# Patient Record
Sex: Female | Born: 1955 | Race: White | Hispanic: No | Smoking: Never smoker
Health system: Southern US, Community
[De-identification: ages and names within clinical notes are randomized; demographics above are authoritative.]

## PROBLEM LIST (undated history)

## (undated) DIAGNOSIS — I10 Essential (primary) hypertension: Secondary | ICD-10-CM

## (undated) DIAGNOSIS — M1990 Unspecified osteoarthritis, unspecified site: Secondary | ICD-10-CM

## (undated) DIAGNOSIS — G4730 Sleep apnea, unspecified: Secondary | ICD-10-CM

## (undated) DIAGNOSIS — I739 Peripheral vascular disease, unspecified: Secondary | ICD-10-CM

## (undated) DIAGNOSIS — H10022 Other mucopurulent conjunctivitis, left eye: Secondary | ICD-10-CM

## (undated) DIAGNOSIS — I219 Acute myocardial infarction, unspecified: Secondary | ICD-10-CM

## (undated) DIAGNOSIS — R011 Cardiac murmur, unspecified: Secondary | ICD-10-CM

## (undated) DIAGNOSIS — J189 Pneumonia, unspecified organism: Secondary | ICD-10-CM

## (undated) DIAGNOSIS — K219 Gastro-esophageal reflux disease without esophagitis: Secondary | ICD-10-CM

## (undated) DIAGNOSIS — Z8669 Personal history of other diseases of the nervous system and sense organs: Secondary | ICD-10-CM

## (undated) HISTORY — PX: OTHER SURGICAL HISTORY: SHX169

---

## 1999-02-25 ENCOUNTER — Inpatient Hospital Stay (HOSPITAL_COMMUNITY): Admission: RE | Admit: 1999-02-25 | Discharge: 1999-02-26 | Payer: Self-pay | Admitting: Neurosurgery

## 1999-02-25 ENCOUNTER — Encounter: Payer: Self-pay | Admitting: Neurosurgery

## 1999-03-25 ENCOUNTER — Encounter: Admission: RE | Admit: 1999-03-25 | Discharge: 1999-03-25 | Payer: Self-pay | Admitting: Neurosurgery

## 1999-03-25 ENCOUNTER — Encounter: Payer: Self-pay | Admitting: Neurosurgery

## 2008-12-18 ENCOUNTER — Emergency Department (HOSPITAL_COMMUNITY): Admission: EM | Admit: 2008-12-18 | Discharge: 2008-12-18 | Payer: Self-pay | Admitting: Family Medicine

## 2010-07-22 ENCOUNTER — Encounter (INDEPENDENT_AMBULATORY_CARE_PROVIDER_SITE_OTHER): Payer: Federal, State, Local not specified - PPO

## 2010-07-22 ENCOUNTER — Encounter (INDEPENDENT_AMBULATORY_CARE_PROVIDER_SITE_OTHER): Payer: Federal, State, Local not specified - PPO | Admitting: Vascular Surgery

## 2010-07-22 DIAGNOSIS — I83893 Varicose veins of bilateral lower extremities with other complications: Secondary | ICD-10-CM

## 2010-07-23 NOTE — Consult Note (Signed)
NEW PATIENT CONSULTATION  Lindsey Murphy, Lindsey Murphy DOB:  1955/11/29                                       07/22/2010 VFIEP#:[id number]  Patient presents today for of bilateral lower extremity pain and swelling.  She does not have any history of prior DVT.  She does have episodes of superficial thrombophlebitis in her left leg in the past. She had an area of wound hematoma and slow wound healing in her right lower calf.  No history of bleeding.  She does have some swelling in both legs and chronic aching in both lower extremities.  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  SUGGESTIONS:  She is married with 1 child.  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  She does not smoke or drink alcohol.  FAMILY HISTORY:  Negative for premature atherosclerotic disease.  REVIEW OF SYSTEMS:  No weight loss or gain.  She weighs 240 pounds.  She is 5 feet 6 inches tall. VASCULAR:  Positive for pain in her legs with walking and phlebitis. GI:  Reflux. MUSCULOSKELETAL:  Arthritis and joint pain. Review of systems otherwise negative.  PHYSICAL EXAMINATION:  Well-developed, well-nourished, moderately obese white female in no acute distress.  Blood pressure is 165/77, pulse 96, respirations 20.  HEENT:  Normal.  Her radial and dorsalis pedis pulses are 2+ bilaterally.  Musculoskeletal shows no major deformity or cyanosis.  Neurologic:  No focal weakness or paresthesias.  Skin without ulcers or rashes.  She does have marked telangiectasia in both lower extremities with no large varicosities.  She underwent noninvasive vascular laboratory studies in our office, and this shows no evidence of valvular reflux within the superficial system bilaterally.  She does have reflux in her deep veins bilaterally.  I discussed the significance of this with patient,  explaining the venous hypertension related to her deep system.  I explained that there is no procedure to correct this since her surface veins appear to be normal. I imaged this myself with SonoSite Ultrasound, and she does not have any large saphenous veins.  I explained the importance of elevation and compression due to her large size.  I feel that she would have a very hard, difficult time wearing thigh-high compressions; therefore, we have fitted her with knee-high compression and instructed her on the use of these.  She understands and will see [place] on an as-needed basis.    Larina Earthly, M.D. Electronically Signed  TFE/MEDQ  D:  07/22/2010  T:  07/23/2010  Job:  [id number]  cc:   [place] Charon

## 2010-07-30 NOTE — Procedures (Unsigned)
LOWER EXTREMITY VENOUS REFLUX EXAM  INDICATION:  Varicose veins.  EXAM:  Using color-flow imaging and pulse Doppler spectral analysis, the right and left common femoral, superficial femoral, popliteal, posterior tibial, greater and lesser saphenous veins are evaluated.  There is no evidence suggesting deep venous insufficiency in the right and left lower extremities.  The right and left saphenofemoral junctions are competent. The right and left GSV's are competent.  The right and left proximal small saphenous vein demonstrates competency.  GSV Diameter (used if found to be incompetent only)                                           Right    Left Proximal Greater Saphenous Vein           cm       cm Proximal-to-mid-thigh                     cm       cm Mid thigh                                 cm       cm Mid-distal thigh                          cm       cm Distal thigh                              cm       cm Knee                                      cm       cm  IMPRESSION: 1. Right and left great saphenous vein is competent. 2. The right and left greater saphenous vein is not tortuous. 3. The deep venous system is competent. 4. The right and left small saphenous vein is competent.  ___________________________________________ Larina Earthly, M.D.  OD/MEDQ  D:  07/22/2010  T:  07/22/2010  Job:  [id number]

## 2010-07-31 NOTE — Op Note (Signed)
[facility]  Patient:    Lindsey Murphy                      MRN: [id number] Proc. Date: 02/25/99 Adm. Date:  [id number] Attending:  Jackelyn Knife                           Operative Report  PREOPERATIVE DIAGNOSIS:  Herniated cervical disk at C5 and C6.  POSTOPERATIVE DIAGNOSIS:  Herniated cervical disk at C5 and C6.  OPERATION PERFORMED:  Anterior cervical diskectomy and fusion of C5 and C6; and  application of anterior cervical plate.  SURGEON:  Izell [place]. Elesa Hacker, M.D.  ASSISTANT:  Clydene Fake, M.D.  ANESTHESIA:  General endotracheal.  INDICATIONS:  The patient is a generally healthy 55 year old lady who presented  with severe neck pain with radiation of pain to her right arm.  This had gone on for a number of weeks and did not respond to conservative measures.  A cervical MRI was done and this demonstrated a quite large C5 and C6 soft disk herniation which was consistent with symptoms.  After an appropriate preoperative workup, as well as careful explanation of the  goals and risks involved, she was admitted for the procedure described below.  DESCRIPTION OF PROCEDURE:  Under general endotracheal anesthesia, this lady was  positioned supine on the operating table and in halter traction.  The anterior cervical region on the left side was prepped and the patient draped in the usual manner.  A preliminary cross table lateral film was taken with a metallic marker in place to more carefully position the incision.  After this was done, an incision was made from the midline on the left side of the neck, extending along the skin lines to the anterior border of the sternocleidomastoid.  Dissection was carried down through the subcutaneous tissue and platysma, and careful attention was paid to ensure adequate hemostasis.  Blunt dissection was then utilized to proceed down to the anterior vertebral border.  Self-retaining  retractors were put into place and a second cross table lateral was taken with the metallic marker the C5 and 6 interspace.  The longus colli muscles on either side of the vertebral bodies were dissected p. Self-retaining retractors were put in place and a Caspar intervertebral disk spreader likewise was put into place after the annulus had been incised with a  #15 blade.  Soft disk material was removed from the interspace all the way down to the posterior longitudinal ligament and central and right-sided, there was noted to be a quite large soft disk herniation posteriorly.  This was removed, after which, the posterior longitudinal ligament was sectioned in its entirety.  Additional oft tissue material was removed from the region of the takeoff of the nerve root on the right side.  After obtaining hemostasis and after making appropriate measurements, a bone graft was fashioned to the appropriate size and shape from allograft iliac crest bone. This tapped into place, after which all traction was removed.  After this, a 24 mm Blackstone anterior cervical plate was put in place using 14 mm screws. Additional cross table lateral films were taken to assure adequate and proper placement of the plate, screws, and bone graft.  After this, the wound was copiously irrigated with antibiotic solution and then  closed in layers.  A sterile dressing was applied.  The patient was taken to the recovery room in  good condition. DD:  02/25/99 TD:  02/26/99 Job: [id number]

## 2013-11-08 ENCOUNTER — Ambulatory Visit: Payer: Self-pay

## 2014-11-21 DIAGNOSIS — Q245 Malformation of coronary vessels: Secondary | ICD-10-CM | POA: Insufficient documentation

## 2014-11-21 DIAGNOSIS — I2510 Atherosclerotic heart disease of native coronary artery without angina pectoris: Secondary | ICD-10-CM

## 2014-11-21 DIAGNOSIS — Z01810 Encounter for preprocedural cardiovascular examination: Secondary | ICD-10-CM | POA: Insufficient documentation

## 2014-11-21 HISTORY — DX: Atherosclerotic heart disease of native coronary artery without angina pectoris: I25.10

## 2014-11-21 HISTORY — DX: Encounter for preprocedural cardiovascular examination: Z01.810

## 2014-11-21 HISTORY — DX: Malformation of coronary vessels: Q24.5

## 2014-12-04 NOTE — H&P (Signed)
TOTAL HIP ADMISSION H&P  Patient is admitted for left total hip arthroplasty, anterior approach.  Subjective:  Chief Complaint: Left hip primary OA / pain  HPI: Lindsey Murphy, 59 y.o. female, has a history of pain and functional disability in the left hip(s) due to arthritis and patient has failed non-surgical conservative treatments for greater than 12 weeks to include NSAID's and/or analgesics, corticosteriod injections, use of assistive devices and activity modification.  Onset of symptoms was gradual starting years ago with rapidlly worsening course for the past 2-3 months.  The patient noted no past surgery on the left hip(s).  Patient currently rates pain in the left hip at 10 out of 10 with activity. Patient has worsening of pain with activity and weight bearing, trendelenberg gait, pain that interfers with activities of daily living and pain with passive range of motion. Patient has evidence of periarticular osteophytes and joint space narrowing by imaging studies. This condition presents safety issues increasing the risk of falls.  There is no current active infection.  Risks, benefits and expectations were discussed with the patient.  Risks including but not limited to the risk of anesthesia, blood clots, nerve damage, blood vessel damage, failure of the prosthesis, infection and up to and including death.  Patient understand the risks, benefits and expectations and wishes to proceed with surgery.   PCP: Everlean Cherry, MD  D/C Plans:      Home with HHPT (mom's house)  Post-op Meds:       No Rx given   Tranexamic Acid:      To be given - topically (previous MI and DVTs)  Decadron:      Is to be given  FYI:     Plavix and ASA post-op  Norco post-op  CPAP    Past Medical History  Diagnosis Date  . Myocardial infarction     12/2012 - vasospasms   . Hypertension   . Heart murmur   . Peripheral vascular disease   . Sleep apnea     cpap- does not know settings  . Pneumonia      hx of pneumonia   . GERD (gastroesophageal reflux disease)   . Arthritis   . Hx of Bell's palsy   . Pink eye disease of left eye     diagnosed 12/06/2014     Past Surgical History  Procedure Laterality Date  . Left eye duct eye surgery     . Acterior cervical fusion     . Right knee surgery       No prescriptions prior to admission   Allergies  Allergen Reactions  . Lovastatin Er [Lovastatin] Other (See Comments)    Muscle aches   . Other     Polymyxin eye drops- eyes itching, swelling and redness   . Penicillins     Has patient had a PCN reaction causing immediate rash, facial/tongue/throat swelling, SOB or lightheadedness with hypotension: Yes Has patient had a PCN reaction causing severe rash involving mucus membranes or skin necrosis: No Has patient had a PCN reaction that required hospitalization No Has patient had a PCN reaction occurring within the last 10 years: Yes If all of the above answers are "NO", then may proceed with Cephalosporin use.     Social History  Substance Use Topics  . Smoking status: Never Smoker   . Smokeless tobacco: Never Used  . Alcohol Use: Not on file        Review of Systems  HENT: Negative.  Eyes: Negative.   Respiratory: Negative.   Cardiovascular: Negative.   Gastrointestinal: Positive for heartburn and constipation.  Genitourinary: Negative.   Musculoskeletal: Positive for back pain and joint pain.  Skin: Positive for itching and rash.  Neurological: Positive for weakness.  Endo/Heme/Allergies: Positive for environmental allergies.  Psychiatric/Behavioral: Negative.       Objective:  Physical Exam  Constitutional: She is oriented to person, place, and time. She appears well-developed and well-nourished.  HENT:  Head: Normocephalic and atraumatic.  Eyes: Pupils are equal, round, and reactive to light.  Neck: Neck supple. No JVD present. No tracheal deviation present. No thyromegaly present.  Cardiovascular: Normal  rate, regular rhythm and intact distal pulses.   Murmur heard. Respiratory: Effort normal and breath sounds normal. No stridor. No respiratory distress. She has no wheezes.  GI: Soft. There is no tenderness. There is no guarding.  Musculoskeletal:       Left hip: She exhibits decreased range of motion, decreased strength, tenderness and bony tenderness. She exhibits no swelling, no deformity and no laceration.  Lymphadenopathy:    She has no cervical adenopathy.  Neurological: She is alert and oriented to person, place, and time.  Skin: Skin is warm and dry.  Psychiatric: She has a normal mood and affect.      Imaging Review Plain radiographs demonstrate severe degenerative joint disease of the left hip(s). The bone quality appears to be good for age and reported activity level.  Assessment/Plan:  End stage arthritis, left hip(s)  The patient history, physical examination, clinical judgement of the provider and imaging studies are consistent with end stage degenerative joint disease of the left hip(s) and total hip arthroplasty is deemed medically necessary. The treatment options including medical management, injection therapy, arthroscopy and arthroplasty were discussed at length. The risks and benefits of total hip arthroplasty were presented and reviewed. The risks due to aseptic loosening, infection, stiffness, dislocation/subluxation,  thromboembolic complications and other imponderables were discussed.  The patient acknowledged the explanation, agreed to proceed with the plan and consent was signed. Patient is being admitted for inpatient treatment for surgery, pain control, PT, OT, prophylactic antibiotics, VTE prophylaxis, progressive ambulation and ADL's and discharge planning.The patient is planning to be discharged home with home health services.      Anastasio Auerbach Samar Dass   PA-C  12/14/2014, 5:59 PM

## 2014-12-05 NOTE — Patient Instructions (Addendum)
Lindsey Murphy  12/05/2014   Your procedure is scheduled on: 12/17/2014    Report to [facility] Main  Entrance take [facility]  elevators to 3rd floor to  Short Stay Center at   1120 AM.  Call this number if you have problems the morning of surgery [phone]   Remember: ONLY 1 PERSON MAY GO WITH YOU TO SHORT STAY TO GET  READY MORNING OF YOUR SURGERY.  Do not eat food after midnite.  May have clear liquids from 12 midnite until 0730am then nothing by mouth.       Take these medicines the morning of surgery with A SIP OF WATER:   Amlodipine ( Norvasc), allegra, Mucinex, metoprolol ( Lopressor), Protonix , Astelin nasal spray, Flonase nasal spray, alaway eye drops  DO NOT TAKE ANY DIABETIC MEDICATIONS DAY OF YOUR SURGERY                               You may not have any metal on your body including hair pins and              piercings  Do not wear jewelry, make-up, lotions, powders or perfumes, deodorant             Do not wear nail polish.  Do not shave  48 hours prior to surgery.                 Do not bring valuables to the hospital. [facility] IS NOT             RESPONSIBLE   FOR VALUABLES.  Contacts, dentures or bridgework may not be worn into surgery.  Leave suitcase in the car. After surgery it may be brought to your room.       Special Instructions: coughing and deep breathing exercises, leg exercises               Please read over the following fact sheets you were given: _____________________________________________________________________             [facility] - Preparing for Surgery Before surgery, you can play an important role.  Because skin is not sterile, your skin needs to be as free of germs as possible.  You can reduce the number of germs on your skin by washing with CHG (chlorahexidine gluconate) soap before surgery.  CHG is an antiseptic cleaner which kills germs and bonds with the skin to continue killing germs even after  washing. Please DO NOT use if you have an allergy to CHG or antibacterial soaps.  If your skin becomes reddened/irritated stop using the CHG and inform your nurse when you arrive at Short Stay. Do not shave (including legs and underarms) for at least 48 hours prior to the first CHG shower.  You may shave your face/neck. Please follow these instructions carefully:  1.  Shower with CHG Soap the night before surgery and the  morning of Surgery.  2.  If you choose to wash your hair, wash your hair first as usual with your  normal  shampoo.  3.  After you shampoo, rinse your hair and body thoroughly to remove the  shampoo.                           4.  Use CHG as you  would any other liquid soap.  You can apply chg directly  to the skin and wash                       Gently with a scrungie or clean washcloth.  5.  Apply the CHG Soap to your body ONLY FROM THE NECK DOWN.   Do not use on face/ open                           Wound or open sores. Avoid contact with eyes, ears mouth and genitals (private parts).                       Wash face,  Genitals (private parts) with your normal soap.             6.  Wash thoroughly, paying special attention to the area where your surgery  will be performed.  7.  Thoroughly rinse your body with warm water from the neck down.  8.  DO NOT shower/wash with your normal soap after using and rinsing off  the CHG Soap.                9.  Pat yourself dry with a clean towel.            10.  Wear clean pajamas.            11.  Place clean sheets on your bed the night of your first shower and do not  sleep with pets. Day of Surgery : Do not apply any lotions/deodorants the morning of surgery.  Please wear clean clothes to the hospital/surgery center.  FAILURE TO FOLLOW THESE INSTRUCTIONS MAY RESULT IN THE CANCELLATION OF YOUR SURGERY PATIENT SIGNATURE_________________________________  NURSE  SIGNATURE__________________________________  ________________________________________________________________________    CLEAR LIQUID DIET   Foods Allowed                                                                     Foods Excluded  Coffee and tea, regular and decaf                             liquids that you cannot  Plain Jell-O in any flavor                                             see through such as: Fruit ices (not with fruit pulp)                                     milk, soups, orange juice  Iced Popsicles                                    All solid food Carbonated beverages, regular and diet  Cranberry, grape and apple juices Sports drinks like Gatorade Lightly seasoned clear broth or consume(fat free) Sugar, honey syrup  Sample Menu Breakfast                                Lunch                                     Supper Cranberry juice                    Beef broth                            Chicken broth Jell-O                                     Grape juice                           Apple juice Coffee or tea                        Jell-O                                      Popsicle                                                Coffee or tea                        Coffee or tea  _____________________________________________________________________   WHAT IS A BLOOD TRANSFUSION? Blood Transfusion Information  A transfusion is the replacement of blood or some of its parts. Blood is made up of multiple cells which provide different functions.  Red blood cells carry oxygen and are used for blood loss replacement.  White blood cells fight against infection.  Platelets control bleeding.  Plasma helps clot blood.  Other blood products are available for specialized needs, such as hemophilia or other clotting disorders. BEFORE THE TRANSFUSION  Who gives blood for transfusions?   Healthy volunteers who are fully evaluated  to make sure their blood is safe. This is blood bank blood. Transfusion therapy is the safest it has ever been in the practice of medicine. Before blood is taken from a donor, a complete history is taken to make sure that person has no history of diseases nor engages in risky social behavior (examples are intravenous drug use or sexual activity with multiple partners). The donor's travel history is screened to minimize risk of transmitting infections, such as malaria. The donated blood is tested for signs of infectious diseases, such as HIV and hepatitis. The blood is then tested to be sure it is compatible with you in order to minimize the chance of a transfusion reaction. If you or a relative donates blood, this is often done in anticipation of surgery and is not appropriate for emergency situations. It takes many days to process the donated blood.  RISKS AND COMPLICATIONS Although transfusion therapy is very safe and saves many lives, the main dangers of transfusion include:  1. Getting an infectious disease. 2. Developing a transfusion reaction. This is an allergic reaction to something in the blood you were given. Every precaution is taken to prevent this. The decision to have a blood transfusion has been considered carefully by your caregiver before blood is given. Blood is not given unless the benefits outweigh the risks. AFTER THE TRANSFUSION  Right after receiving a blood transfusion, you will usually feel much better and more energetic. This is especially true if your red blood cells have gotten low (anemic). The transfusion raises the level of the red blood cells which carry oxygen, and this usually causes an energy increase.  The nurse administering the transfusion will monitor you carefully for complications. HOME CARE INSTRUCTIONS  No special instructions are needed after a transfusion. You may find your energy is better. Speak with your caregiver about any limitations on activity for  underlying diseases you may have. SEEK MEDICAL CARE IF:   Your condition is not improving after your transfusion.  You develop redness or irritation at the intravenous (IV) site. SEEK IMMEDIATE MEDICAL CARE IF:  Any of the following symptoms occur over the next 12 hours:  Shaking chills.  You have a temperature by mouth above 102 F (38.9 C), not controlled by medicine.  Chest, back, or muscle pain.  People around you feel you are not acting correctly or are confused.  Shortness of breath or difficulty breathing.  Dizziness and fainting.  You get a rash or develop hives.  You have a decrease in urine output.  Your urine turns a dark color or changes to pink, red, or brown. Any of the following symptoms occur over the next 10 days:  You have a temperature by mouth above 102 F (38.9 C), not controlled by medicine.  Shortness of breath.  Weakness after normal activity.  The white part of the eye turns yellow (jaundice).  You have a decrease in the amount of urine or are urinating less often.  Your urine turns a dark color or changes to pink, red, or brown. Document Released: 02/27/2000 Document Revised: 05/24/2011 Document Reviewed: 10/16/2007 ExitCare Patient Information 2014 [place].  _______________________________________________________________________  Incentive Spirometer  An incentive spirometer is a tool that can help keep your lungs clear and active. This tool measures how well you are filling your lungs with each breath. Taking long deep breaths may help reverse or decrease the chance of developing breathing (pulmonary) problems (especially infection) following:  A long period of time when you are unable to move or be active. BEFORE THE PROCEDURE   If the spirometer includes an indicator to show your best effort, your nurse or respiratory therapist will set it to a desired goal.  If possible, sit up straight or lean slightly forward. Try not to  slouch.  Hold the incentive spirometer in an upright position. INSTRUCTIONS FOR USE  3. Sit on the edge of your bed if possible, or sit up as far as you can in bed or on a chair. 4. Hold the incentive spirometer in an upright position. 5. Breathe out normally. 6. Place the mouthpiece in your mouth and seal your lips tightly around it. 7. Breathe in slowly and as deeply as possible, raising the piston or the ball toward the top of the column. 8. Hold your breath for 3-5 seconds or for as long as possible. Allow the piston  or ball to fall to the bottom of the column. 9. Remove the mouthpiece from your mouth and breathe out normally. 10. Rest for a few seconds and repeat Steps 1 through 7 at least 10 times every 1-2 hours when you are awake. Take your time and take a few normal breaths between deep breaths. 11. The spirometer may include an indicator to show your best effort. Use the indicator as a goal to work toward during each repetition. 12. After each set of 10 deep breaths, practice coughing to be sure your lungs are clear. If you have an incision (the cut made at the time of surgery), support your incision when coughing by placing a pillow or rolled up towels firmly against it. Once you are able to get out of bed, walk around indoors and cough well. You may stop using the incentive spirometer when instructed by your caregiver.  RISKS AND COMPLICATIONS  Take your time so you do not get dizzy or light-headed.  If you are in pain, you may need to take or ask for pain medication before doing incentive spirometry. It is harder to take a deep breath if you are having pain. AFTER USE  Rest and breathe slowly and easily.  It can be helpful to keep track of a log of your progress. Your caregiver can provide you with a simple table to help with this. If you are using the spirometer at home, follow these instructions: [place] IF:   You are having difficultly using the  spirometer.  You have trouble using the spirometer as often as instructed.  Your pain medication is not giving enough relief while using the spirometer.  You develop fever of 100.5 F (38.1 C) or higher. SEEK IMMEDIATE MEDICAL CARE IF:   You cough up bloody sputum that had not been present before.  You develop fever of 102 F (38.9 C) or greater.  You develop worsening pain at or near the incision site. MAKE SURE YOU:   Understand these instructions.  Will watch your condition.  Will get help right away if you are not doing well or get worse. Document Released: 07/12/2006 Document Revised: 05/24/2011 Document Reviewed: 09/12/2006 [facility] Patient Information 2014 [place], [place].   ________________________________________________________________________

## 2014-12-10 ENCOUNTER — Encounter (HOSPITAL_COMMUNITY): Payer: Self-pay

## 2014-12-10 ENCOUNTER — Encounter (HOSPITAL_COMMUNITY)
Admission: RE | Admit: 2014-12-10 | Discharge: 2014-12-10 | Disposition: A | Discharge status: Home / Self Care | Payer: 59 | Source: Ambulatory Visit | Attending: Orthopedic Surgery | Admitting: Orthopedic Surgery

## 2014-12-10 DIAGNOSIS — Z01818 Encounter for other preprocedural examination: Secondary | ICD-10-CM | POA: Diagnosis not present

## 2014-12-10 DIAGNOSIS — M1612 Unilateral primary osteoarthritis, left hip: Secondary | ICD-10-CM | POA: Insufficient documentation

## 2014-12-10 HISTORY — DX: Cardiac murmur, unspecified: R01.1

## 2014-12-10 HISTORY — DX: Sleep apnea, unspecified: G47.30

## 2014-12-10 HISTORY — DX: Pneumonia, unspecified organism: J18.9

## 2014-12-10 HISTORY — DX: Essential (primary) hypertension: I10

## 2014-12-10 HISTORY — DX: Peripheral vascular disease, unspecified: I73.9

## 2014-12-10 HISTORY — DX: Acute myocardial infarction, unspecified: I21.9

## 2014-12-10 HISTORY — DX: Unspecified osteoarthritis, unspecified site: M19.90

## 2014-12-10 HISTORY — DX: Personal history of other diseases of the nervous system and sense organs: Z86.69

## 2014-12-10 HISTORY — DX: Gastro-esophageal reflux disease without esophagitis: K21.9

## 2014-12-10 HISTORY — DX: Other mucopurulent conjunctivitis, left eye: H10.022

## 2014-12-10 LAB — CBC
HCT: 40.4 % (ref 36.0–46.0)
HEMOGLOBIN: 13 g/dL (ref 12.0–15.0)
MCH: 32 pg (ref 26.0–34.0)
MCHC: 32.2 g/dL (ref 30.0–36.0)
MCV: 99.5 fL (ref 78.0–100.0)
PLATELETS: 183 10*3/uL (ref 150–400)
RBC: 4.06 MIL/uL (ref 3.87–5.11)
RDW: 13.4 % (ref 11.5–15.5)
WBC: 5.8 10*3/uL (ref 4.0–10.5)

## 2014-12-10 LAB — BASIC METABOLIC PANEL
Anion gap: 8 (ref 5–15)
BUN: 18 mg/dL (ref 6–20)
CALCIUM: 9.4 mg/dL (ref 8.9–10.3)
CHLORIDE: 104 mmol/L (ref 101–111)
CO2: 29 mmol/L (ref 22–32)
CREATININE: 0.7 mg/dL (ref 0.44–1.00)
GFR calc non Af Amer: >60 mL/min (ref >60)
GLUCOSE: 110 mg/dL — AB (ref 65–99)
Potassium: 3.7 mmol/L (ref 3.5–5.1)
Sodium: 141 mmol/L (ref 135–145)

## 2014-12-10 LAB — URINALYSIS, ROUTINE W REFLEX MICROSCOPIC
BILIRUBIN URINE: NEGATIVE
Glucose, UA: NEGATIVE mg/dL
Hgb urine dipstick: NEGATIVE
KETONES UR: NEGATIVE mg/dL
LEUKOCYTES UA: NEGATIVE
NITRITE: NEGATIVE
Protein, ur: NEGATIVE mg/dL
Specific Gravity, Urine: 1.018 (ref 1.005–1.030)
UROBILINOGEN UA: 0.2 mg/dL (ref 0.0–1.0)
pH: 5.5 (ref 5.0–8.0)

## 2014-12-10 LAB — PROTIME-INR
INR: 1.16 (ref 0.00–1.49)
PROTHROMBIN TIME: 14.9 s (ref 11.6–15.2)

## 2014-12-10 LAB — SURGICAL PCR SCREEN
MRSA, PCR: NEGATIVE
STAPHYLOCOCCUS AUREUS: NEGATIVE

## 2014-12-10 LAB — APTT: aPTT: 29 seconds (ref 24–37)

## 2014-12-10 NOTE — Progress Notes (Addendum)
Clearance Dr Dulce Sellar 11/25/2014 on chart  Clearance DR Martha Clan- on chart  Stress Echo- 02/2014 on chart  EKG- 03/19/2014 on chart  LOV with Dr Dulce Sellar- 11/25/14 on [facility] in [facility]

## 2014-12-11 ENCOUNTER — Encounter (HOSPITAL_COMMUNITY): Payer: Self-pay

## 2014-12-11 LAB — ABO/RH: ABO/RH(D): O POS

## 2014-12-11 NOTE — Progress Notes (Signed)
Left message on voice mail of Lindsey Murphy regarding pink eye diagnosis to make sure DR Charlann Boxer aware and that patient had notified office.

## 2014-12-11 NOTE — Progress Notes (Signed)
Patient came to preop appt on 12/10/14 with diagnosed pink eye.  Had reaction to eye drops on 12/07/14.  Patient to pick up new eye drop script on 12/10/14.  Instructed patient to let Dr Charlann Boxer be aware.  Patient voiced understanding.

## 2014-12-11 NOTE — Progress Notes (Signed)
Rosalva Ferron called back from office of Dr Charlann Boxer and stated she would inform him of the pink eye diagnosis on 12/12/2014.

## 2014-12-17 ENCOUNTER — Inpatient Hospital Stay (HOSPITAL_COMMUNITY)
Admission: RE | Admit: 2014-12-17 | Discharge: 2014-12-18 | DRG: 470 | Disposition: A | Discharge status: Home Health | Payer: 59 | Source: Ambulatory Visit | Attending: Orthopedic Surgery | Admitting: Orthopedic Surgery

## 2014-12-17 ENCOUNTER — Inpatient Hospital Stay (HOSPITAL_COMMUNITY): Payer: 59

## 2014-12-17 ENCOUNTER — Inpatient Hospital Stay (HOSPITAL_COMMUNITY): Payer: 59 | Admitting: Certified Registered"

## 2014-12-17 ENCOUNTER — Encounter (HOSPITAL_COMMUNITY)
Admission: RE | Disposition: A | Discharge status: Home Health | Payer: Self-pay | Source: Ambulatory Visit | Attending: Orthopedic Surgery

## 2014-12-17 ENCOUNTER — Encounter (HOSPITAL_COMMUNITY): Payer: Self-pay | Admitting: *Deleted

## 2014-12-17 DIAGNOSIS — M1612 Unilateral primary osteoarthritis, left hip: Secondary | ICD-10-CM | POA: Diagnosis present

## 2014-12-17 DIAGNOSIS — Z9889 Other specified postprocedural states: Secondary | ICD-10-CM

## 2014-12-17 DIAGNOSIS — Z981 Arthrodesis status: Secondary | ICD-10-CM

## 2014-12-17 DIAGNOSIS — Z01812 Encounter for preprocedural laboratory examination: Secondary | ICD-10-CM | POA: Diagnosis not present

## 2014-12-17 DIAGNOSIS — Z6838 Body mass index (BMI) 38.0-38.9, adult: Secondary | ICD-10-CM | POA: Diagnosis not present

## 2014-12-17 DIAGNOSIS — K219 Gastro-esophageal reflux disease without esophagitis: Secondary | ICD-10-CM | POA: Diagnosis present

## 2014-12-17 DIAGNOSIS — M25552 Pain in left hip: Secondary | ICD-10-CM | POA: Diagnosis present

## 2014-12-17 DIAGNOSIS — I252 Old myocardial infarction: Secondary | ICD-10-CM

## 2014-12-17 DIAGNOSIS — E669 Obesity, unspecified: Secondary | ICD-10-CM | POA: Diagnosis present

## 2014-12-17 DIAGNOSIS — Z96649 Presence of unspecified artificial hip joint: Secondary | ICD-10-CM | POA: Insufficient documentation

## 2014-12-17 DIAGNOSIS — G4730 Sleep apnea, unspecified: Secondary | ICD-10-CM | POA: Diagnosis present

## 2014-12-17 DIAGNOSIS — I10 Essential (primary) hypertension: Secondary | ICD-10-CM | POA: Diagnosis present

## 2014-12-17 HISTORY — DX: Presence of unspecified artificial hip joint: Z96.649

## 2014-12-17 HISTORY — DX: Other specified postprocedural states: Z98.890

## 2014-12-17 HISTORY — PX: TOTAL HIP ARTHROPLASTY: SHX124

## 2014-12-17 LAB — TYPE AND SCREEN
ABO/RH(D): O POS
ANTIBODY SCREEN: NEGATIVE

## 2014-12-17 SURGERY — ARTHROPLASTY, HIP, TOTAL, ANTERIOR APPROACH
Anesthesia: Spinal | Site: Hip | Laterality: Left

## 2014-12-17 MED ORDER — CELECOXIB 200 MG PO CAPS
200.0000 mg | ORAL_CAPSULE | Freq: Two times a day (BID) | ORAL | Status: DC
  Administered 2014-12-17 – 2014-12-18 (×2): 200 mg via ORAL
  Filled 2014-12-17 (×3): qty 1

## 2014-12-17 MED ORDER — PROMETHAZINE HCL 25 MG/ML IJ SOLN: 6.2500 mg | INTRAMUSCULAR | Status: DC | PRN

## 2014-12-17 MED ORDER — CEFAZOLIN SODIUM-DEXTROSE 2-3 GM-% IV SOLR: 2.0000 g | INTRAVENOUS | Status: DC

## 2014-12-17 MED ORDER — CLINDAMYCIN PHOSPHATE 900 MG/50ML IV SOLN
900.0000 mg | Freq: Once | INTRAVENOUS | Status: AC
  Administered 2014-12-17: 900 mg via INTRAVENOUS

## 2014-12-17 MED ORDER — METOPROLOL TARTRATE 25 MG PO TABS
25.0000 mg | ORAL_TABLET | Freq: Two times a day (BID) | ORAL | Status: DC
  Administered 2014-12-17: 25 mg via ORAL
  Filled 2014-12-17 (×3): qty 1

## 2014-12-17 MED ORDER — HYDROMORPHONE HCL 1 MG/ML IJ SOLN
0.2500 mg | INTRAMUSCULAR | Status: DC | PRN
  Administered 2014-12-17: 0.5 mg via INTRAVENOUS

## 2014-12-17 MED ORDER — ONDANSETRON HCL 4 MG PO TABS: 4.0000 mg | ORAL_TABLET | Freq: Four times a day (QID) | ORAL | Status: DC | PRN

## 2014-12-17 MED ORDER — METHOCARBAMOL 1000 MG/10ML IJ SOLN
500.0000 mg | Freq: Four times a day (QID) | INTRAVENOUS | Status: DC | PRN
  Filled 2014-12-17: qty 5

## 2014-12-17 MED ORDER — TRANEXAMIC ACID 1000 MG/10ML IV SOLN
2000.0000 mg | INTRAVENOUS | Status: DC | PRN
  Administered 2014-12-17: 2000 mg via TOPICAL

## 2014-12-17 MED ORDER — 0.9 % SODIUM CHLORIDE (POUR BTL) OPTIME
TOPICAL | Status: DC | PRN
  Administered 2014-12-17: 1000 mL

## 2014-12-17 MED ORDER — MEPERIDINE HCL 50 MG/ML IJ SOLN: 6.2500 mg | INTRAMUSCULAR | Status: DC | PRN

## 2014-12-17 MED ORDER — DOCUSATE SODIUM 100 MG PO CAPS
100.0000 mg | ORAL_CAPSULE | Freq: Two times a day (BID) | ORAL | Status: DC
  Administered 2014-12-17 – 2014-12-18 (×2): 100 mg via ORAL

## 2014-12-17 MED ORDER — METOCLOPRAMIDE HCL 10 MG PO TABS: 5.0000 mg | ORAL_TABLET | Freq: Three times a day (TID) | ORAL | Status: DC | PRN

## 2014-12-17 MED ORDER — METHOCARBAMOL 500 MG PO TABS
500.0000 mg | ORAL_TABLET | Freq: Four times a day (QID) | ORAL | Status: DC | PRN
  Administered 2014-12-18 (×2): 500 mg via ORAL
  Filled 2014-12-17 (×2): qty 1

## 2014-12-17 MED ORDER — PANTOPRAZOLE SODIUM 40 MG PO TBEC
40.0000 mg | DELAYED_RELEASE_TABLET | Freq: Two times a day (BID) | ORAL | Status: DC
  Administered 2014-12-17 – 2014-12-18 (×2): 40 mg via ORAL
  Filled 2014-12-17 (×3): qty 1

## 2014-12-17 MED ORDER — NITROGLYCERIN 0.4 MG SL SUBL: 0.4000 mg | SUBLINGUAL_TABLET | SUBLINGUAL | Status: DC | PRN

## 2014-12-17 MED ORDER — BUPIVACAINE HCL (PF) 0.5 % IJ SOLN
INTRAMUSCULAR | Status: DC | PRN
  Administered 2014-12-17: 3 mL via INTRATHECAL

## 2014-12-17 MED ORDER — PROPOFOL 10 MG/ML IV BOLUS
INTRAVENOUS | Status: AC
  Filled 2014-12-17: qty 20

## 2014-12-17 MED ORDER — HALOBETASOL PROPIONATE 0.05 % EX CREA
TOPICAL_CREAM | Freq: Two times a day (BID) | CUTANEOUS | Status: DC
  Administered 2014-12-18: via TOPICAL

## 2014-12-17 MED ORDER — CHLORHEXIDINE GLUCONATE 4 % EX LIQD: 60.0000 mL | Freq: Once | CUTANEOUS | Status: DC

## 2014-12-17 MED ORDER — MIDAZOLAM HCL 2 MG/2ML IJ SOLN
INTRAMUSCULAR | Status: AC
  Filled 2014-12-17: qty 4

## 2014-12-17 MED ORDER — MAGNESIUM CITRATE PO SOLN: 1.0000 | Freq: Once | ORAL | Status: DC | PRN

## 2014-12-17 MED ORDER — VANCOMYCIN HCL IN DEXTROSE 1-5 GM/200ML-% IV SOLN
1000.0000 mg | Freq: Two times a day (BID) | INTRAVENOUS | Status: AC
  Administered 2014-12-17: 1000 mg via INTRAVENOUS
  Filled 2014-12-17: qty 200

## 2014-12-17 MED ORDER — ASPIRIN EC 325 MG PO TBEC
325.0000 mg | DELAYED_RELEASE_TABLET | Freq: Every day | ORAL | Status: DC
  Administered 2014-12-18: 325 mg via ORAL
  Filled 2014-12-17 (×2): qty 1

## 2014-12-17 MED ORDER — FLUTICASONE PROPIONATE 50 MCG/ACT NA SUSP
2.0000 | Freq: Every day | NASAL | Status: DC
  Administered 2014-12-18: 2 via NASAL
  Filled 2014-12-17: qty 16

## 2014-12-17 MED ORDER — MONTELUKAST SODIUM 10 MG PO TABS
10.0000 mg | ORAL_TABLET | Freq: Every day | ORAL | Status: DC
  Administered 2014-12-17: 10 mg via ORAL
  Filled 2014-12-17 (×2): qty 1

## 2014-12-17 MED ORDER — PROPOFOL 10 MG/ML IV BOLUS
INTRAVENOUS | Status: DC | PRN
  Administered 2014-12-17 (×2): 20 mg via INTRAVENOUS

## 2014-12-17 MED ORDER — HYDROMORPHONE HCL 1 MG/ML IJ SOLN: 0.5000 mg | INTRAMUSCULAR | Status: DC | PRN

## 2014-12-17 MED ORDER — LIDOCAINE HCL (CARDIAC) 20 MG/ML IV SOLN
INTRAVENOUS | Status: AC
  Filled 2014-12-17: qty 5

## 2014-12-17 MED ORDER — AMLODIPINE BESYLATE 5 MG PO TABS
5.0000 mg | ORAL_TABLET | Freq: Every day | ORAL | Status: DC
  Administered 2014-12-17: 5 mg via ORAL
  Filled 2014-12-17 (×2): qty 1

## 2014-12-17 MED ORDER — POLYETHYLENE GLYCOL 3350 17 G PO PACK
17.0000 g | PACK | Freq: Two times a day (BID) | ORAL | Status: DC
  Administered 2014-12-17 – 2014-12-18 (×2): 17 g via ORAL

## 2014-12-17 MED ORDER — DEXAMETHASONE SODIUM PHOSPHATE 10 MG/ML IJ SOLN
10.0000 mg | Freq: Once | INTRAMUSCULAR | Status: AC
  Administered 2014-12-17: 10 mg via INTRAVENOUS

## 2014-12-17 MED ORDER — MENTHOL 3 MG MT LOZG: 1.0000 | LOZENGE | OROMUCOSAL | Status: DC | PRN

## 2014-12-17 MED ORDER — ATORVASTATIN CALCIUM 10 MG PO TABS
10.0000 mg | ORAL_TABLET | Freq: Every day | ORAL | Status: DC
Start: 2014-12-17 — End: 2014-12-18
  Administered 2014-12-17: 10 mg via ORAL
  Filled 2014-12-17 (×2): qty 1

## 2014-12-17 MED ORDER — LACTATED RINGERS IV SOLN
INTRAVENOUS | Status: DC
  Administered 2014-12-17 (×2): 1000 mL via INTRAVENOUS
  Administered 2014-12-17 (×3): via INTRAVENOUS

## 2014-12-17 MED ORDER — ONDANSETRON HCL 4 MG/2ML IJ SOLN: 4.0000 mg | Freq: Four times a day (QID) | INTRAMUSCULAR | Status: DC | PRN

## 2014-12-17 MED ORDER — AZELASTINE HCL 0.1 % NA SOLN
1.0000 | Freq: Two times a day (BID) | NASAL | Status: DC
  Administered 2014-12-17 – 2014-12-18 (×2): 1 via NASAL
  Filled 2014-12-17: qty 30

## 2014-12-17 MED ORDER — ONDANSETRON HCL 4 MG/2ML IJ SOLN
INTRAMUSCULAR | Status: DC | PRN
  Administered 2014-12-17: 4 mg via INTRAVENOUS

## 2014-12-17 MED ORDER — CLINDAMYCIN PHOSPHATE 900 MG/50ML IV SOLN
INTRAVENOUS | Status: AC
  Filled 2014-12-17: qty 50

## 2014-12-17 MED ORDER — KETOTIFEN FUMARATE 0.025 % OP SOLN
1.0000 [drp] | Freq: Two times a day (BID) | OPHTHALMIC | Status: DC
  Administered 2014-12-17 – 2014-12-18 (×2): 1 [drp] via OPHTHALMIC
  Filled 2014-12-17: qty 5

## 2014-12-17 MED ORDER — ROCURONIUM BROMIDE 100 MG/10ML IV SOLN
INTRAVENOUS | Status: AC
  Filled 2014-12-17: qty 1

## 2014-12-17 MED ORDER — LIDOCAINE HCL (CARDIAC) 20 MG/ML IV SOLN
INTRAVENOUS | Status: DC | PRN
  Administered 2014-12-17: 50 mg via INTRAVENOUS

## 2014-12-17 MED ORDER — HYDROMORPHONE HCL 1 MG/ML IJ SOLN
INTRAMUSCULAR | Status: AC
  Filled 2014-12-17: qty 1

## 2014-12-17 MED ORDER — CLOPIDOGREL BISULFATE 75 MG PO TABS
75.0000 mg | ORAL_TABLET | Freq: Every day | ORAL | Status: DC
  Administered 2014-12-18: 75 mg via ORAL
  Filled 2014-12-17 (×3): qty 1

## 2014-12-17 MED ORDER — BISACODYL 10 MG RE SUPP: 10.0000 mg | Freq: Every day | RECTAL | Status: DC | PRN

## 2014-12-17 MED ORDER — METOCLOPRAMIDE HCL 5 MG/ML IJ SOLN: 5.0000 mg | Freq: Three times a day (TID) | INTRAMUSCULAR | Status: DC | PRN

## 2014-12-17 MED ORDER — FERROUS SULFATE 325 (65 FE) MG PO TABS
325.0000 mg | ORAL_TABLET | Freq: Three times a day (TID) | ORAL | Status: DC
  Administered 2014-12-18 (×2): 325 mg via ORAL
  Filled 2014-12-17 (×4): qty 1

## 2014-12-17 MED ORDER — FENTANYL CITRATE (PF) 100 MCG/2ML IJ SOLN
INTRAMUSCULAR | Status: DC | PRN
  Administered 2014-12-17: 100 ug via INTRAVENOUS

## 2014-12-17 MED ORDER — PHENOL 1.4 % MT LIQD: 1.0000 | OROMUCOSAL | Status: DC | PRN

## 2014-12-17 MED ORDER — LORATADINE 10 MG PO TABS
10.0000 mg | ORAL_TABLET | Freq: Every day | ORAL | Status: DC
  Administered 2014-12-18: 10 mg via ORAL
  Filled 2014-12-17: qty 1

## 2014-12-17 MED ORDER — ONDANSETRON HCL 4 MG/2ML IJ SOLN
INTRAMUSCULAR | Status: AC
  Filled 2014-12-17: qty 2

## 2014-12-17 MED ORDER — PHENYLEPHRINE HCL 10 MG/ML IJ SOLN
INTRAMUSCULAR | Status: DC | PRN
  Administered 2014-12-17: 80 ug via INTRAVENOUS
  Administered 2014-12-17: 40 ug via INTRAVENOUS
  Administered 2014-12-17 (×3): 80 ug via INTRAVENOUS

## 2014-12-17 MED ORDER — TRANEXAMIC ACID 1000 MG/10ML IV SOLN
2000.0000 mg | Freq: Once | INTRAVENOUS | Status: DC
  Filled 2014-12-17: qty 20

## 2014-12-17 MED ORDER — GABAPENTIN 100 MG PO CAPS
100.0000 mg | ORAL_CAPSULE | Freq: Two times a day (BID) | ORAL | Status: DC
  Administered 2014-12-17 – 2014-12-18 (×2): 100 mg via ORAL
  Filled 2014-12-17 (×3): qty 1

## 2014-12-17 MED ORDER — BUPIVACAINE HCL (PF) 0.5 % IJ SOLN
INTRAMUSCULAR | Status: AC
Start: 2014-12-17 — End: 2014-12-17
  Filled 2014-12-17: qty 30

## 2014-12-17 MED ORDER — DIPHENHYDRAMINE HCL 25 MG PO CAPS: 25.0000 mg | ORAL_CAPSULE | Freq: Four times a day (QID) | ORAL | Status: DC | PRN

## 2014-12-17 MED ORDER — DEXAMETHASONE SODIUM PHOSPHATE 10 MG/ML IJ SOLN
10.0000 mg | Freq: Once | INTRAMUSCULAR | Status: AC
  Administered 2014-12-18: 10 mg via INTRAVENOUS
  Filled 2014-12-17: qty 1

## 2014-12-17 MED ORDER — GUAIFENESIN ER 600 MG PO TB12
600.0000 mg | ORAL_TABLET | Freq: Two times a day (BID) | ORAL | Status: DC
  Administered 2014-12-17 – 2014-12-18 (×2): 600 mg via ORAL
  Filled 2014-12-17 (×3): qty 1

## 2014-12-17 MED ORDER — PROPOFOL 500 MG/50ML IV EMUL
INTRAVENOUS | Status: DC | PRN
  Administered 2014-12-17: 75 ug/kg/min via INTRAVENOUS

## 2014-12-17 MED ORDER — SODIUM CHLORIDE 0.9 % IV SOLN
100.0000 mL/h | INTRAVENOUS | Status: DC
  Administered 2014-12-17 – 2014-12-18 (×2): 100 mL/h via INTRAVENOUS
  Filled 2014-12-17 (×4): qty 1000

## 2014-12-17 MED ORDER — FENTANYL CITRATE (PF) 250 MCG/5ML IJ SOLN
INTRAMUSCULAR | Status: AC
  Filled 2014-12-17: qty 25

## 2014-12-17 MED ORDER — LACTATED RINGERS IV SOLN: INTRAVENOUS | Status: DC

## 2014-12-17 MED ORDER — MIDAZOLAM HCL 5 MG/5ML IJ SOLN
INTRAMUSCULAR | Status: DC | PRN
  Administered 2014-12-17: 2 mg via INTRAVENOUS

## 2014-12-17 MED ORDER — HYDROCODONE-ACETAMINOPHEN 7.5-325 MG PO TABS
1.0000 | ORAL_TABLET | ORAL | Status: DC
  Administered 2014-12-17 – 2014-12-18 (×5): 2 via ORAL
  Administered 2014-12-18: 1 via ORAL
  Filled 2014-12-17: qty 1
  Filled 2014-12-17 (×4): qty 2
  Filled 2014-12-17: qty 1
  Filled 2014-12-17: qty 2

## 2014-12-17 MED ORDER — FENTANYL CITRATE (PF) 100 MCG/2ML IJ SOLN
INTRAMUSCULAR | Status: AC
  Filled 2014-12-17: qty 4

## 2014-12-17 MED ORDER — ALUM & MAG HYDROXIDE-SIMETH 200-200-20 MG/5ML PO SUSP: 30.0000 mL | ORAL | Status: DC | PRN

## 2014-12-17 MED ORDER — DEXAMETHASONE SODIUM PHOSPHATE 10 MG/ML IJ SOLN
INTRAMUSCULAR | Status: AC
  Filled 2014-12-17: qty 1

## 2014-12-17 SURGICAL SUPPLY — 42 items
BAG DECANTER FOR FLEXI CONT (MISCELLANEOUS) ×2 IMPLANT
BAG ZIPLOCK 12X15 (MISCELLANEOUS) IMPLANT
CAPT HIP TOTAL 2 ×2 IMPLANT
COVER PERINEAL POST (MISCELLANEOUS) ×2 IMPLANT
DRAPE C-ARM 42X120 X-RAY (DRAPES) ×2 IMPLANT
DRAPE LG THREE QUARTER DISP (DRAPES) ×2 IMPLANT
DRAPE STERI IOBAN 125X83 (DRAPES) ×2 IMPLANT
DRAPE U-SHAPE 47X51 STRL (DRAPES) ×6 IMPLANT
DRSG AQUACEL AG ADV 3.5X10 (GAUZE/BANDAGES/DRESSINGS) ×2 IMPLANT
DURAPREP 26ML APPLICATOR (WOUND CARE) ×2 IMPLANT
ELECT BLADE TIP CTD 4 INCH (ELECTRODE) ×2 IMPLANT
ELECT REM PT RETURN 9FT ADLT (ELECTROSURGICAL) ×2
ELECTRODE REM PT RTRN 9FT ADLT (ELECTROSURGICAL) ×1 IMPLANT
FACESHIELD WRAPAROUND (MASK) ×8 IMPLANT
GLOVE BIOGEL PI IND STRL 7.5 (GLOVE) ×1 IMPLANT
GLOVE BIOGEL PI IND STRL 8.5 (GLOVE) ×1 IMPLANT
GLOVE BIOGEL PI INDICATOR 7.5 (GLOVE) ×1
GLOVE BIOGEL PI INDICATOR 8.5 (GLOVE) ×1
GLOVE ECLIPSE 8.0 STRL XLNG CF (GLOVE) ×4 IMPLANT
GLOVE ORTHO TXT STRL SZ7.5 (GLOVE) ×2 IMPLANT
GOWN SPEC L3 XXLG W/TWL (GOWN DISPOSABLE) ×2 IMPLANT
GOWN STRL REUS W/TWL LRG LVL3 (GOWN DISPOSABLE) ×2 IMPLANT
HOLDER FOLEY CATH W/STRAP (MISCELLANEOUS) ×2 IMPLANT
KIT BASIN OR (CUSTOM PROCEDURE TRAY) ×2 IMPLANT
LIQUID BAND (GAUZE/BANDAGES/DRESSINGS) ×2 IMPLANT
NDL SAFETY ECLIPSE 18X1.5 (NEEDLE) ×1 IMPLANT
NEEDLE HYPO 18GX1.5 SHARP (NEEDLE) ×1
PACK TOTAL JOINT (CUSTOM PROCEDURE TRAY) ×2 IMPLANT
PEN SKIN MARKING BROAD (MISCELLANEOUS) ×2 IMPLANT
SAW OSC TIP CART 19.5X105X1.3 (SAW) ×2 IMPLANT
SLEEVE SURGEON STRL (DRAPES) ×2 IMPLANT
SUT MNCRL AB 4-0 PS2 18 (SUTURE) ×2 IMPLANT
SUT VIC AB 1 CT1 36 (SUTURE) ×6 IMPLANT
SUT VIC AB 2-0 CT1 27 (SUTURE) ×2
SUT VIC AB 2-0 CT1 TAPERPNT 27 (SUTURE) ×2 IMPLANT
SUT VLOC 180 0 24IN GS25 (SUTURE) ×2 IMPLANT
SYR 50ML LL SCALE MARK (SYRINGE) ×2 IMPLANT
TOWEL OR 17X26 10 PK STRL BLUE (TOWEL DISPOSABLE) ×2 IMPLANT
TOWEL OR NON WOVEN STRL DISP B (DISPOSABLE) ×2 IMPLANT
TRAY FOLEY W/METER SILVER 14FR (SET/KITS/TRAYS/PACK) ×2 IMPLANT
WATER STERILE IRR 1500ML POUR (IV SOLUTION) ×2 IMPLANT
YANKAUER SUCT BULB TIP 10FT TU (MISCELLANEOUS) ×2 IMPLANT

## 2014-12-17 NOTE — Anesthesia Preprocedure Evaluation (Addendum)
Anesthesia Evaluation  Patient identified by MRN, date of birth, ID band Patient awake    Reviewed: Allergy & Precautions, NPO status , Patient's Chart, lab work & pertinent test results, reviewed documented beta blocker date and time   Airway Mallampati: II  TM Distance: >3 FB Neck ROM: Full    Dental  (+) Teeth Intact   Pulmonary sleep apnea and Continuous Positive Airway Pressure Ventilation ,    breath sounds clear to auscultation       Cardiovascular hypertension, Pt. on medications and Pt. on home beta blockers + Past MI and + Peripheral Vascular Disease   Rhythm:Regular Rate:Normal + Systolic murmurs    Neuro/Psych negative neurological ROS  negative psych ROS   GI/Hepatic Neg liver ROS, GERD  Medicated,  Endo/Other  negative endocrine ROS  Renal/GU negative Renal ROS  negative genitourinary   Musculoskeletal  (+) Arthritis , Osteoarthritis,    Abdominal   Peds negative pediatric ROS (+)  Hematology negative hematology ROS (+)   Anesthesia Other Findings   Reproductive/Obstetrics negative OB ROS                            Lab Results  Component Value Date   WBC 5.8 12/10/2014   HGB 13.0 12/10/2014   HCT 40.4 12/10/2014   MCV 99.5 12/10/2014   PLT 183 12/10/2014   Lab Results  Component Value Date   CREATININE 0.70 12/10/2014   BUN 18 12/10/2014   NA 141 12/10/2014   K 3.7 12/10/2014   CL 104 12/10/2014   CO2 29 12/10/2014   Lab Results  Component Value Date   INR 1.16 12/10/2014     Anesthesia Physical Anesthesia Plan  ASA: III  Anesthesia Plan: Spinal   Post-op Pain Management:    Induction: Intravenous  Airway Management Planned: Nasal Cannula and Simple Face Mask  Additional Equipment:   Intra-op Plan:   Post-operative Plan:   Informed Consent: I have reviewed the patients History and Physical, chart, labs and discussed the procedure including the  risks, benefits and alternatives for the proposed anesthesia with the patient or authorized representative who has indicated his/her understanding and acceptance.   Dental advisory given  Plan Discussed with: CRNA  Anesthesia Plan Comments:         Anesthesia Quick Evaluation

## 2014-12-17 NOTE — Transfer of Care (Signed)
Immediate Anesthesia Transfer of Care Note  Patient: Lindsey Murphy  Procedure(s) Performed: Procedure(s): LEFT TOTAL HIP ARTHROPLASTY ANTERIOR APPROACH (Left)  Patient Location: PACU  Anesthesia Type:Regional  Level of Consciousness: awake, alert  and oriented  Airway & Oxygen Therapy: Patient Spontanous Breathing and Patient connected to face mask oxygen  Post-op Assessment: Report given to RN and Post -op Vital signs reviewed and stable  Post vital signs: Reviewed and stable  Last Vitals:  Filed Vitals:   12/17/14 1120  BP: 122/58  Pulse: 75  Temp: 36.6 C  Resp: 16    Complications: No apparent anesthesia complications

## 2014-12-17 NOTE — Anesthesia Procedure Notes (Signed)
Spinal Patient location during procedure: OR Start time: 12/17/2014 1:55 PM End time: 12/17/2014 2:05 PM Staffing Anesthesiologist: HOLLIS, KEVIN D Performed by: anesthesiologist  Preanesthetic Checklist Completed: patient identified, site marked, surgical consent, pre-op evaluation, timeout performed, IV checked, risks and benefits discussed and monitors and equipment checked Spinal Block Patient position: sitting Prep: Betadine Patient monitoring: heart rate, continuous pulse ox, blood pressure and cardiac monitor Approach: midline Location: L4-5 Injection technique: single-shot Needle Needle type: Introducer and Sprotte  Needle gauge: 24 G Needle length: 9 cm Additional Notes Negative paresthesia. Negative blood return. Positive free-flowing CSF. Expiration date of kit checked and confirmed. Patient tolerated procedure well, without complications.     

## 2014-12-17 NOTE — Interval H&P Note (Signed)
History and Physical Interval Note:  12/17/2014 12:33 PM  Lindsey Murphy  has presented today for surgery, with the diagnosis of LEFT HIP OA  The various methods of treatment have been discussed with the patient and family. After consideration of risks, benefits and other options for treatment, the patient has consented to  Procedure(s): LEFT TOTAL HIP ARTHROPLASTY ANTERIOR APPROACH (Left) as a surgical intervention .  The patient's history has been reviewed, patient examined, no change in status, stable for surgery.  I have reviewed the patient's chart and labs.  Questions were answered to the patient's satisfaction.     Shelda Pal

## 2014-12-17 NOTE — Op Note (Signed)
NAME:  Lindsey Murphy                ACCOUNT NO.: [id number]      MEDICAL RECORD NO.: [id number]      FACILITY:  [facility]      PHYSICIAN:  Durene Romans D  DATE OF BIRTH:  September 12, 1955     DATE OF PROCEDURE:  12/17/2014                                 OPERATIVE REPORT         PREOPERATIVE DIAGNOSIS: Left  hip osteoarthritis.      POSTOPERATIVE DIAGNOSIS:  Left hip osteoarthritis.      PROCEDURE:  Left total hip replacement through an anterior approach   utilizing DePuy THR system, component size 37mm pinnacle cup, a size 36+4 neutral   Altrex liner, a size 6 Hi Tri Lock stem with a 36+5 delta ceramic   ball.      SURGEON:  Madlyn Frankel. Charlann Boxer, M.D.      ASSISTANT:  Skip Mayer, PA-C      ANESTHESIA:  Spinal.      SPECIMENS:  None.      COMPLICATIONS:  None.      BLOOD LOSS:  900 cc     DRAINS:  None.      INDICATION OF THE PROCEDURE:  Lindsey Murphy is a 59 y.o. female who had   presented to office for evaluation of left hip pain.  Radiographs revealed   progressive degenerative changes with bone-on-bone   articulation to the  hip joint.  The patient had painful limited range of   motion significantly affecting their overall quality of life.  The patient was failing to    respond to conservative measures, and at this point was ready   to proceed with more definitive measures.  The patient has noted progressive   degenerative changes in his hip, progressive problems and dysfunction   with regarding the hip prior to surgery.  Consent was obtained for   benefit of pain relief.  Specific risk of infection, DVT, component   failure, dislocation, need for revision surgery, as well discussion of   the anterior versus posterior approach were reviewed.  Consent was   obtained for benefit of anterior pain relief through an anterior   approach.      PROCEDURE IN DETAIL:  The patient was brought to operative theater.   Once adequate anesthesia, preoperative  antibiotics, 2gm of Ancef and 10 mg of Decadron were administered.  Additionally we injected 2 gm of Tranexamic Acid as topical form after closure of fascia over hip joint.   The patient was positioned supine on the OSI Hanna table.  Once adequate   padding of boney process was carried out, we had predraped out the hip, and  used fluoroscopy to confirm orientation of the pelvis and position.      The left hip was then prepped and draped from proximal iliac crest to   mid thigh with shower curtain technique.      Time-out was performed identifying the patient, planned procedure, and   extremity.     An incision was then made 2 cm distal and lateral to the   anterior superior iliac spine extending over the orientation of the   tensor fascia lata muscle and sharp dissection was carried down to the   fascia of the muscle and  protractor placed in the soft tissues.      The fascia was then incised.  The muscle belly was identified and swept   laterally and retractor placed along the superior neck.  Following   cauterization of the circumflex vessels and removing some pericapsular   fat, a second cobra retractor was placed on the inferior neck.  A third   retractor was placed on the anterior acetabulum after elevating the   anterior rectus.  A L-capsulotomy was along the line of the   superior neck to the trochanteric fossa, then extended proximally and   distally.  Tag sutures were placed and the retractors were then placed   intracapsular.  We then identified the trochanteric fossa and   orientation of my neck cut, confirmed this radiographically   and then made a neck osteotomy with the femur on traction.  The femoral   head was removed without difficulty or complication.  Traction was let   off and retractors were placed posterior and anterior around the   acetabulum.      The labrum and foveal tissue were debrided.  I began reaming with a 49mm   reamer and reamed up to 53mm reamer with  good bony bed preparation and a 54mm   cup was chosen.  The final 54mm Pinnacle cup was then impacted under fluoroscopy  to confirm the depth of penetration and orientation with respect to   abduction.  A screw was placed followed by the hole eliminator.  The final   36+4 neutral Altrex liner was impacted with good visualized rim fit.  The cup was positioned anatomically within the acetabular portion of the pelvis.      At this point, the femur was rolled at 80 degrees.  Further capsule was   released off the inferior aspect of the femoral neck.  I then   released the superior capsule proximally.  The hook was placed laterally   along the femur and elevated manually and held in position with the bed   hook.  The leg was then extended and adducted with the leg rolled to 100   degrees of external rotation.  Once the proximal femur was fully   exposed, I used a box osteotome to set orientation.  I then began   broaching with the starting chili pepper broach and passed this by hand and then broached up to 6.  With the 6 broach in place I chose a high offset neck and did several trial reductions.  The offset was appropriate, leg lengths   appeared to be equal using the +5 head ball versus the +1.5, confirmed radiographically.   Given these findings, I went ahead and dislocated the hip, repositioned all   retractors and positioned the right hip in the extended and abducted position.  The final 6 Hi Tri Lock stem was   chosen and it was impacted down to the level of neck cut.  Based on this   and the trial reduction, a 36+5 delta ceramic ball was chosen and   impacted onto a clean and dry trunnion, and the hip was reduced.  The   hip had been irrigated throughout the case again at this point.  I did   reapproximate the superior capsular leaflet to the anterior leaflet   using #1 Vicryl.  The fascia of the   tensor fascia lata muscle was then reapproximated using #1 Vicryl and #0 V-lock sutures.  The    remaining wound was closed  with 2-0 Vicryl and running 4-0 Monocryl.   The hip was cleaned, dried, and dressed sterilely using Dermabond and   Aquacel dressing.  She was then brought   to recovery room in stable condition tolerating the procedure well.    Lanney Gins, PA-C was present for the entirety of the case involved from   preoperative positioning, perioperative retractor management, general   facilitation of the case, as well as primary wound closure as assistant.            Madlyn Frankel Charlann Boxer, M.D.        12/17/2014 3:50 PM

## 2014-12-17 NOTE — Anesthesia Postprocedure Evaluation (Signed)
  Anesthesia Post-op Note  Patient: Lindsey Murphy  Procedure(s) Performed: Procedure(s): LEFT TOTAL HIP ARTHROPLASTY ANTERIOR APPROACH (Left)  Patient Location: PACU  Anesthesia Type:Spinal  Level of Consciousness: awake, alert  and oriented  Airway and Oxygen Therapy: Patient Spontanous Breathing  Post-op Pain: none  Post-op Assessment: Post-op Vital signs reviewed and Patient's Cardiovascular Status Stable         L Sensory Level: L2-Upper inner thigh, upper buttock R Sensory Level: L2-Upper inner thigh, upper buttock  Post-op Vital Signs: Reviewed and stable  Last Vitals:  Filed Vitals:   12/17/14 1745  BP: 96/50  Pulse: 67  Temp:   Resp: 17    Complications: No apparent anesthesia complications

## 2014-12-18 ENCOUNTER — Encounter (HOSPITAL_COMMUNITY): Payer: Self-pay | Admitting: Orthopedic Surgery

## 2014-12-18 LAB — CBC
HCT: 34.2 % — ABNORMAL LOW (ref 36.0–46.0)
Hemoglobin: 11.1 g/dL — ABNORMAL LOW (ref 12.0–15.0)
MCH: 31.8 pg (ref 26.0–34.0)
MCHC: 32.5 g/dL (ref 30.0–36.0)
MCV: 98 fL (ref 78.0–100.0)
PLATELETS: 165 10*3/uL (ref 150–400)
RBC: 3.49 MIL/uL — ABNORMAL LOW (ref 3.87–5.11)
RDW: 13 % (ref 11.5–15.5)
WBC: 9.9 10*3/uL (ref 4.0–10.5)

## 2014-12-18 LAB — BASIC METABOLIC PANEL
Anion gap: 4 — ABNORMAL LOW (ref 5–15)
BUN: 11 mg/dL (ref 6–20)
CALCIUM: 8.9 mg/dL (ref 8.9–10.3)
CO2: 28 mmol/L (ref 22–32)
Chloride: 107 mmol/L (ref 101–111)
Creatinine, Ser: 0.57 mg/dL (ref 0.44–1.00)
GFR calc Af Amer: >60 mL/min (ref >60)
GLUCOSE: 139 mg/dL — AB (ref 65–99)
Potassium: 4.2 mmol/L (ref 3.5–5.1)
Sodium: 139 mmol/L (ref 135–145)

## 2014-12-18 MED ORDER — ASPIRIN 325 MG PO TBEC: 325.0000 mg | DELAYED_RELEASE_TABLET | Freq: Every day | ORAL | Status: AC

## 2014-12-18 MED ORDER — FERROUS SULFATE 325 (65 FE) MG PO TABS: 325.0000 mg | ORAL_TABLET | Freq: Three times a day (TID) | ORAL | Status: DC

## 2014-12-18 MED ORDER — METHOCARBAMOL 500 MG PO TABS: 500.0000 mg | ORAL_TABLET | Freq: Four times a day (QID) | ORAL | Status: DC | PRN

## 2014-12-18 MED ORDER — HYDROCODONE-ACETAMINOPHEN 7.5-325 MG PO TABS: 1.0000 | ORAL_TABLET | ORAL | Status: DC | PRN

## 2014-12-18 MED ORDER — POLYETHYLENE GLYCOL 3350 17 G PO PACK
17.0000 g | PACK | Freq: Two times a day (BID) | ORAL | Status: DC
Start: 2014-12-18 — End: 2016-09-16

## 2014-12-18 MED ORDER — DOCUSATE SODIUM 100 MG PO CAPS: 100.0000 mg | ORAL_CAPSULE | Freq: Two times a day (BID) | ORAL | Status: DC

## 2014-12-18 NOTE — Care Management Note (Signed)
Case Management Note  Patient Details  Name: Lindsey Murphy MRN: [id number] Date of Birth: [birth year]  Subjective/Objective:                   LEFT TOTAL HIP ARTHROPLASTY ANTERIOR APPROACH (Left) Action/Plan: Discharge planning  Expected Discharge Date:  12/18/14               Expected Discharge Plan:  [place]  In-House Referral:     Discharge planning Services  CM Consult  Post Acute Care Choice:  Home Health Choice offered to:  Patient  DME Arranged:  N/A DME Agency:     HH Arranged:  PT HH Agency:  [facility]  Status of Service:  Completed, signed off  Medicare Important Message Given:    Date Medicare IM Given:    Medicare IM give by:    Date Additional Medicare IM Given:    Additional Medicare Important Message give by:     If discussed at [place] of Stay Meetings, dates discussed:    Additional Comments: CM met with pt in room to offer choice of home health agency.  Pt chooses [facility] to render HHPT.  Pt will be recuperating at her mother's address: [address], [place] [address] and additional contact number is [phone].  Pt has transfer tub bench, bedside commode, and rolling walker at home.  No other CM needs were communicated.   Dellie Catholic, RN 12/18/2014, 10:21 AM

## 2014-12-18 NOTE — Progress Notes (Signed)
     Subjective: 1 Day Post-Op Procedure(s) (LRB): LEFT TOTAL HIP ARTHROPLASTY ANTERIOR APPROACH (Left)   Patient reports pain as mild, pain controlled. No events throughout the night. Starting to work her hip a little while in bed.  Ready to be discharged home.  Objective:   VITALS:   Filed Vitals:   12/18/14 0426  BP: 116/62  Pulse: 66  Temp: 98.2 F (36.8 C)  Resp: 16    Dorsiflexion/Plantar flexion intact Incision: dressing C/D/I No cellulitis present Compartment soft  LABS  Recent Labs  12/18/14 0445  HGB 11.1*  HCT 34.2*  WBC 9.9  PLT 165     Recent Labs  12/18/14 0445  NA 139  K 4.2  BUN 11  CREATININE 0.57  GLUCOSE 139*     Assessment/Plan: 1 Day Post-Op Procedure(s) (LRB): LEFT TOTAL HIP ARTHROPLASTY ANTERIOR APPROACH (Left) Foley cath d/c'ed Advance diet Up with therapy D/C IV fluids Discharge home with home health  Follow up in 2 weeks at [facility]. Follow up with OLIN,Taraji Mungo D in 2 weeks.  Contact information:  [facility] [address] [place] [address] [phone]    Obese (BMI 30-39.9) Estimated body mass index is 38.77 kg/(m^2) as calculated from the following:   Height as of this encounter: 5\' 4"  (1.626 m).   Weight as of this encounter: 102.513 kg (226 lb). Patient also counseled that weight may inhibit the healing process Patient counseled that losing weight will help with future health issues       Anastasio Auerbach. Kenji Mapel   PAC  12/18/2014, 8:20 AM

## 2014-12-18 NOTE — Progress Notes (Signed)
CM received call from [place] rep, Jorja Loa stating [facility] could not, after all, render HHPT services for pt.  CM called [facility] and per Robert's request, faxed facesheet with appropriate address for recuperation, order, H&P, OP note, PT EVAL note, and progress note to [phone].  Preferred Home Health agency is [facility] and noted on face of fax.  [facility] rep, Tamala Bari aware and waiting for [facility] to authorize.  CM called pt to explain her insurance is not accepted by [place] and she will be receiving a call from [facility] in the am of 12/19/14.  CM gave pt personal number to call if she had no response by noon.  Cm will follow up with [facility], [facility] and pt to ensure pt's HHPT arranged.

## 2014-12-18 NOTE — Discharge Instructions (Signed)

## 2014-12-18 NOTE — Evaluation (Signed)
Physical Therapy Evaluation Patient Details Name: Lindsey Murphy MRN: [id number] DOB: [birth year] Today's Date: 12/18/2014   History of Present Illness  Pt is a 59 year old female s/p L direct anterior THA  Clinical Impression  Pt is s/p L THA resulting in the deficits listed below (see PT Problem List).  Pt will benefit from skilled PT to increase their independence and safety with mobility to allow discharge to the venue listed below.  Pt mobilizing well POD#1 and plans to d/c home with her mother to assist.       Follow Up Recommendations Home health PT    Equipment Recommendations  None recommended by PT    Recommendations for Other Services       Precautions / Restrictions Precautions Precautions: None Restrictions Other Position/Activity Restrictions: WBAT      Mobility  Bed Mobility Overal bed mobility: Needs Assistance Bed Mobility: Supine to Sit     Supine to sit: Supervision;HOB elevated     General bed mobility comments: pt able to self assist L LE over EOB  Transfers Overall transfer level: Needs assistance Equipment used: Rolling walker (2 wheeled) Transfers: Sit to/from Stand Sit to Stand: Min guard         General transfer comment: verbal cues for safe technique  Ambulation/Gait Ambulation/Gait assistance: Min guard Ambulation Distance (Feet): 160 Feet Assistive device: Rolling walker (2 wheeled) Gait Pattern/deviations: Step-to pattern;Antalgic     General Gait Details: verbal cues for sequence, RW distance, posture  Stairs            Wheelchair Mobility    Modified Rankin (Stroke Patients Only)       Balance                                             Pertinent Vitals/Pain Pain Assessment: 0-10 Pain Score: 2  Pain Location: L hip Pain Descriptors / Indicators: Sore Pain Intervention(s): Limited activity within patient's tolerance;Monitored during session;Repositioned;Ice applied    Home Living  Family/patient expects to be discharged to:: Private residence Living Arrangements: Alone (plans to stay at her mother's home) Available Help at Discharge: Family Type of Home: House Home Access: Ramped entrance     Home Layout: One level Home Equipment: Environmental consultant - 2 wheels;Wheelchair - manual;Tub bench;Bedside commode      Prior Function Level of Independence: Independent               Hand Dominance        Extremity/Trunk Assessment               Lower Extremity Assessment: LLE deficits/detail   LLE Deficits / Details: decreased functional hip strength observed     Communication   Communication: No difficulties  Cognition Arousal/Alertness: Awake/alert Behavior During Therapy: WFL for tasks assessed/performed Overall Cognitive Status: Within Functional Limits for tasks assessed                      General Comments      Exercises Total Joint Exercises Ankle Circles/Pumps: AROM;Both;10 reps Quad Sets: AROM;Both;10 reps Towel Squeeze: AROM;Both;10 reps Short Arc Quad: AROM;Both;10 reps Heel Slides: AAROM;10 reps;Left Hip ABduction/ADduction: AAROM;Left;10 reps Long Arc Quad: 10 reps;Left;AROM Marching in Standing: Seated;AROM;Left;10 reps      Assessment/Plan    PT Assessment Patient needs continued PT services  PT Diagnosis Difficulty walking   PT Problem  List Decreased strength;Decreased activity tolerance;Decreased mobility;Decreased knowledge of use of DME;Pain  PT Treatment Interventions Functional mobility training;Gait training;DME instruction;Patient/family education;Therapeutic activities;Therapeutic exercise   PT Goals (Current goals can be found in the Care Plan section) Acute Rehab PT Goals PT Goal Formulation: With patient Time For Goal Achievement: 12/21/14 Potential to Achieve Goals: Good    Frequency 7X/week   Barriers to discharge        Co-evaluation               End of Session Equipment Utilized During  Treatment: Gait belt Activity Tolerance: Patient tolerated treatment well Patient left: in chair;with call bell/phone within reach           Time: 0927-0955 PT Time Calculation (min) (ACUTE ONLY): 28 min   Charges:   PT Evaluation $Initial PT Evaluation Tier I: 1 Procedure PT Treatments $Therapeutic Exercise: 8-22 mins   PT G Codes:        [place]in Vinsant,KATHrine E 12/18/2014, 12:39 PM Zenovia Jarred, PT, DPT 12/18/2014 Pager: [phone]

## 2014-12-18 NOTE — Progress Notes (Signed)
Physical Therapy  Treatment Note    12/18/14 1520  PT Visit Information  Last PT Received On 12/18/14  Assistance Needed +1  History of Present Illness Pt is a 59 year old female s/p L direct anterior THA  PT Time Calculation  PT Start Time (ACUTE ONLY) 1406  PT Stop Time (ACUTE ONLY) 1418  PT Time Calculation (min) (ACUTE ONLY) 12 min  Subjective Data  Subjective Pt ambulated good distance in hallway and feels ready for d/c home with her mother today.  Pt had no further questions/concerns.  Precautions  Precautions None  Restrictions  Other Position/Activity Restrictions WBAT  Pain Assessment  Pain Assessment 0-10  Pain Score 2  Pain Location Left Hip  Pain Descriptors / Indicators Aching;Sore  Pain Intervention(s) Monitored during session;Ice applied  Cognition  Arousal/Alertness Awake/alert  Behavior During Therapy WFL for tasks assessed/performed  Overall Cognitive Status Within Functional Limits for tasks assessed  Transfers  Overall transfer level Needs assistance  Equipment used Rolling walker (2 wheeled)  Transfers Sit to/from Stand  Sit to Stand Supervision  Ambulation/Gait  Ambulation/Gait assistance Supervision  Ambulation Distance (Feet) 160 Feet  Assistive device Rolling walker (2 wheeled)  Gait Pattern/deviations Step-to pattern;Antalgic;Trunk flexed  General Gait Details verbal cues for posture and decreasing L LE toe in  PT - End of Session  Activity Tolerance Patient tolerated treatment well  Patient left in chair;with call bell/phone within reach  PT - Assessment/Plan  PT Plan Current plan remains appropriate  PT Frequency (ACUTE ONLY) 7X/week  Follow Up Recommendations Home health PT  PT equipment None recommended by PT  PT Goal Progression  Progress towards PT goals Progressing toward goals  PT General Charges  $$ ACUTE PT VISIT 1 Procedure  PT Treatments  $Gait Training 8-22 mins   Zenovia Jarred, PT, DPT 12/18/2014 Pager: [phone]

## 2014-12-18 NOTE — Evaluation (Signed)
Occupational Therapy Evaluation Patient Details Name: Lindsey Murphy MRN: [id number] DOB: 04-Sep-1955 Today's Date: 12/18/2014    History of Present Illness Pt is a 59 year old female s/p L direct anterior THA   Clinical Impression   Patient evaluated by Occupational Therapy with no further acute OT needs identified. All education has been completed and the patient has no further questions. All education completed.  See below for any follow-up Occupational Therapy or equipment needs. OT is signing off. Thank you for this referral.      Follow Up Recommendations  No OT follow up;Supervision/Assistance - 24 hour    Equipment Recommendations  None recommended by OT    Recommendations for Other Services       Precautions / Restrictions Precautions Precautions: None Restrictions Weight Bearing Restrictions: Yes Other Position/Activity Restrictions: WBAT      Mobility Bed Mobility Overal bed mobility: Needs Assistance Bed Mobility: Supine to Sit     Supine to sit: Supervision;HOB elevated     General bed mobility comments: pt able to self assist L LE over EOB  Transfers Overall transfer level: Needs assistance Equipment used: Rolling walker (2 wheeled) Transfers: Sit to/from [employer] Sit to Stand: Supervision         General transfer comment: verbal cues for safe technique    Balance                                            ADL Overall ADL's : Needs assistance/impaired Eating/Feeding: Independent   Grooming: Wash/dry hands;Wash/dry face;Oral care;Brushing hair;Supervision/safety;Standing   Upper Body Bathing: Set up;Sitting   Lower Body Bathing: Supervison/ safety;Sit to/from stand   Upper Body Dressing : Set up;Sitting   Lower Body Dressing: Supervision/safety;Sit to/from stand Lower Body Dressing Details (indicate cue type and reason): Pt has reacher and instructed her how to use to assist with LB dressing.   She was also instructed in use of sock aid and was able to don TEDs and sock with extra wide sock aide  Toilet Transfer: Supervision/safety;Ambulation;Comfort height toilet;Grab bars;RW   Toileting- Clothing Manipulation and Hygiene: Supervision/safety;Sit to/from stand       Functional mobility during ADLs: Supervision/safety;Rolling walker General ADL Comments: Pt very motivated      Advice worker      Pertinent Vitals/Pain Pain Assessment: 0-10 Pain Score: 2  Pain Location: Lt hip Pain Descriptors / Indicators: Aching Pain Intervention(s): Monitored during session     Hand Dominance     Extremity/Trunk Assessment Upper Extremity Assessment Upper Extremity Assessment: Overall WFL for tasks assessed   Lower Extremity Assessment Lower Extremity Assessment: Defer to PT evaluation LLE Deficits / Details: decreased functional hip strength observed   Cervical / Trunk Assessment Cervical / Trunk Assessment: Normal   Communication Communication Communication: No difficulties   Cognition Arousal/Alertness: Awake/alert Behavior During Therapy: WFL for tasks assessed/performed Overall Cognitive Status: Within Functional Limits for tasks assessed                     General Comments       Exercises       Shoulder Instructions      Home Living Family/patient expects to be discharged to:: Private residence Living Arrangements: Alone Available Help at Discharge: Family Type of Home: House Home Access: Ramped entrance  Home Layout: One level     Bathroom Shower/Tub: Producer, television/film/video: Handicapped height     Home Equipment: Environmental consultant - 2 wheels;Wheelchair - manual;Tub bench;Bedside commode;Adaptive equipment Adaptive Equipment: Reacher        Prior Functioning/Environment Level of Independence: Independent             OT Diagnosis: Generalized weakness;Acute pain   OT Problem List:     OT  Treatment/Interventions:      OT Goals(Current goals can be found in the care plan section) Acute Rehab OT Goals Patient Stated Goal: to go home  OT Goal Formulation: All assessment and education complete, DC therapy Potential to Achieve Goals: Good  OT Frequency:     Barriers to D/C:            Co-evaluation              End of Session Equipment Utilized During Treatment: Rolling walker Nurse Communication: Mobility status  Activity Tolerance: Patient tolerated treatment well Patient left: in chair;with call bell/phone within reach   Time: 1224-1302 OT Time Calculation (min): 38 min Charges:  OT General Charges $OT Visit: 1 Procedure OT Evaluation $Initial OT Evaluation Tier I: 1 Procedure OT Treatments $Self Care/Home Management : 23-37 mins G-Codes:    Shavon Ashmore M 12/31/2014, 3:09 PM

## 2014-12-19 NOTE — Progress Notes (Signed)
CM received call from [facility] Rep, Patsy Lager who states she cannot find an agency to render HHPT until Sunday.  CM relayed message to P. Babish, PA who states pt follow the discharge nurses instructions, follow the PT/OT discharge instructions and will have a follow up appointment with surgeon within 2 weeks and it is not necessary for HHPT at this time.  Phineas Semen, PA also recc pt call the office if she would feel better going to outpt therapy.  CM called patient and discussed the aforementioned call from [place] and responding information from PA.  Pt states though she is disappointed with her insurance Counselling psychologist), she feels confident she will do well with her exercises recc by PT/OT and states if she feels she is not progressing well, will call Dr. Nilsa Nutting office for help.  No other CM needs were communicated.

## 2014-12-20 NOTE — Discharge Summary (Signed)
Physician Discharge Summary  Patient ID: Lindsey Murphy MRN: [id number] DOB/AGE: 1955/04/25 59 y.o.  Admit date: 12/17/2014 Discharge date: 12/18/2014   Procedures:  Procedure(s) (LRB): LEFT TOTAL HIP ARTHROPLASTY ANTERIOR APPROACH (Left)  Attending Physician:  Dr. Durene Romans   Admission Diagnoses:   Left hip primary OA / pain  Discharge Diagnoses:  Principal Problem:   S/P left THA, AA  Past Medical History  Diagnosis Date  . Myocardial infarction (HCC)     12/2012 - vasospasms   . Hypertension   . Heart murmur   . Peripheral vascular disease (HCC)   . Sleep apnea     cpap- does not know settings  . Pneumonia     hx of pneumonia   . GERD (gastroesophageal reflux disease)   . Arthritis   . Hx of Bell's palsy   . Pink eye disease of left eye     diagnosed 12/06/2014     HPI:    Lindsey Murphy, 59 y.o. female, has a history of pain and functional disability in the left hip(s) due to arthritis and patient has failed non-surgical conservative treatments for greater than 12 weeks to include NSAID's and/or analgesics, corticosteriod injections, use of assistive devices and activity modification. Onset of symptoms was gradual starting years ago with rapidlly worsening course for the past 2-3 months. The patient noted no past surgery on the left hip(s). Patient currently rates pain in the left hip at 10 out of 10 with activity. Patient has worsening of pain with activity and weight bearing, trendelenberg gait, pain that interfers with activities of daily living and pain with passive range of motion. Patient has evidence of periarticular osteophytes and joint space narrowing by imaging studies. This condition presents safety issues increasing the risk of falls. There is no current active infection. Risks, benefits and expectations were discussed with the patient. Risks including but not limited to the risk of anesthesia, blood clots, nerve damage, blood vessel damage, failure  of the prosthesis, infection and up to and including death. Patient understand the risks, benefits and expectations and wishes to proceed with surgery.   PCP: Everlean Cherry, MD   Discharged Condition: good  Hospital Course:  Patient underwent the above stated procedure on 12/17/2014. Patient tolerated the procedure well and brought to the recovery room in good condition and subsequently to the floor.  POD #1 BP: 116/62 ; Pulse: 66 ; Temp: 98.2 F (36.8 C) ; Resp: 16 Patient reports pain as mild, pain controlled. No events throughout the night. Starting to work her hip a little while in bed. Ready to be discharged home. Dorsiflexion/plantar flexion intact, incision: dressing C/D/I, no cellulitis present and compartment soft.   LABS  Basename    HGB  11.1  HCT  34.2    Discharge Exam: General appearance: alert, cooperative and no distress Extremities: Homans sign is negative, no sign of DVT, no edema, redness or tenderness in the calves or thighs and no ulcers, gangrene or trophic changes  Disposition: Home with follow up in 2 weeks   Follow-up Information    Follow up with Shelda Pal, MD. Schedule an appointment as soon as possible for a visit in 2 weeks.   Specialty:  Orthopedic Surgery   Contact information:   [address] [place] [address] [phone]       Follow up with [facility].   Why:  will call you with your home health physical therapy arrangement      Discharge Instructions  Call MD / Call 911    Complete by:  As directed   If you experience chest pain or shortness of breath, CALL 911 and be transported to the hospital emergency room.  If you develope a fever above 101 F, pus (white drainage) or increased drainage or redness at the wound, or calf pain, call your surgeon's office.     Change dressing    Complete by:  As directed   Maintain surgical dressing until follow up in the clinic. If the edges start to pull up,  may reinforce with tape. If the dressing is no longer working, may remove and cover with gauze and tape, but must keep the area dry and clean.  Call with any questions or concerns.     Constipation Prevention    Complete by:  As directed   Drink plenty of fluids.  Prune juice may be helpful.  You may use a stool softener, such as Colace (over the counter) 100 mg twice a day.  Use MiraLax (over the counter) for constipation as needed.     Diet - low sodium heart healthy    Complete by:  As directed      Discharge instructions    Complete by:  As directed   Maintain surgical dressing until follow up in the clinic. If the edges start to pull up, may reinforce with tape. If the dressing is no longer working, may remove and cover with gauze and tape, but must keep the area dry and clean.  Follow up in 2 weeks at [facility]. Call with any questions or concerns.     Increase activity slowly as tolerated    Complete by:  As directed   Weight bearing as tolerated with assist device (walker, cane, etc) as directed, use it as long as suggested by your surgeon or therapist, typically at least 4-6 weeks.     TED hose    Complete by:  As directed   Use stockings (TED hose) for 2 weeks on both leg(s).  You may remove them at night for sleeping.             Medication List    STOP taking these medications        meloxicam 7.5 MG tablet  Commonly known as:  MOBIC     traMADol 50 MG tablet  Commonly known as:  ULTRAM      TAKE these medications        ALAWAY 0.025 % ophthalmic solution  Generic drug:  ketotifen  1 drop 2 (two) times daily.     ALLEGRA ALLERGY 180 MG tablet  Generic drug:  fexofenadine  Take 180 mg by mouth daily.     amLODipine 5 MG tablet  Commonly known as:  NORVASC  Take 5 mg by mouth daily.     aspirin 325 MG EC tablet  Take 1 tablet (325 mg total) by mouth daily.     atorvastatin 10 MG tablet  Commonly known as:  LIPITOR  Take 10 mg by mouth daily.       azelastine 0.1 % nasal spray  Commonly known as:  ASTELIN  Place into both nostrils 2 (two) times daily. Use in each nostril as directed     BENEFIBER DRINK MIX PO  Take 1 scoop by mouth daily.     CALCIUM 600+D3 600-800 MG-UNIT Tabs  Generic drug:  Calcium Carb-Cholecalciferol  Take 1 tablet by mouth 2 (two) times daily.     cholecalciferol  1000 UNITS tablet  Commonly known as:  VITAMIN D  Take 1,000 Units by mouth daily.     clopidogrel 75 MG tablet  Commonly known as:  PLAVIX  Take 75 mg by mouth daily.     docusate sodium 100 MG capsule  Commonly known as:  COLACE  Take 1 capsule (100 mg total) by mouth 2 (two) times daily.     ferrous sulfate 325 (65 FE) MG tablet  Take 1 tablet (325 mg total) by mouth 3 (three) times daily after meals.     fluticasone 50 MCG/ACT nasal spray  Commonly known as:  FLONASE  Place 2 sprays into both nostrils daily. Bedtime     gabapentin 100 MG capsule  Commonly known as:  NEURONTIN  Take 100 mg by mouth 2 (two) times daily. 1 capsule in am and 2 capsules in the pm     GLUCOS-CHONDROIT-MSM COMPLEX PO  Take 1 tablet by mouth 2 (two) times daily.     guaiFENesin 600 MG 12 hr tablet  Commonly known as:  MUCINEX  Take 600 mg by mouth 2 (two) times daily.     halobetasol 0.05 % cream  Commonly known as:  ULTRAVATE  Apply topically 2 (two) times daily. As needed     HYDROcodone-acetaminophen 7.5-325 MG tablet  Commonly known as:  NORCO  Take 1-2 tablets by mouth every 4 (four) hours as needed for moderate pain.     methocarbamol 500 MG tablet  Commonly known as:  ROBAXIN  Take 1 tablet (500 mg total) by mouth every 6 (six) hours as needed for muscle spasms.     metoprolol tartrate 25 MG tablet  Commonly known as:  LOPRESSOR  Take 25 mg by mouth 2 (two) times daily.     montelukast 10 MG tablet  Commonly known as:  SINGULAIR  Take 10 mg by mouth at bedtime.     multivitamin with minerals Tabs tablet  Take 1 tablet by  mouth daily.     nitroGLYCERIN 0.4 MG SL tablet  Commonly known as:  NITROSTAT  Place 0.4 mg under the tongue every 5 (five) minutes as needed for chest pain.     pantoprazole 40 MG tablet  Commonly known as:  PROTONIX  Take 40 mg by mouth 2 (two) times daily.     polyethylene glycol packet  Commonly known as:  MIRALAX / GLYCOLAX  Take 17 g by mouth 2 (two) times daily.     SALMON OIL-1000 PO  Take 2 tablets by mouth 2 (two) times daily.     STELARA 90 MG/ML Sosy injection  Generic drug:  ustekinumab  Inject 1 Dose into the skin every 3 (three) months.     vitamin C 500 MG tablet  Commonly known as:  ASCORBIC ACID  Take 500 mg by mouth daily.         Signed: Anastasio Auerbach. Khrystyne Arpin   PA-C  12/20/2014, 11:09 AM

## 2015-04-27 DIAGNOSIS — I252 Old myocardial infarction: Secondary | ICD-10-CM

## 2015-04-27 DIAGNOSIS — E785 Hyperlipidemia, unspecified: Secondary | ICD-10-CM

## 2015-04-27 DIAGNOSIS — Z79899 Other long term (current) drug therapy: Secondary | ICD-10-CM

## 2015-04-27 HISTORY — DX: Hyperlipidemia, unspecified: E78.5

## 2015-04-27 HISTORY — DX: Old myocardial infarction: I25.2

## 2015-04-27 HISTORY — DX: Other long term (current) drug therapy: Z79.899

## 2015-04-29 DIAGNOSIS — L4050 Arthropathic psoriasis, unspecified: Secondary | ICD-10-CM

## 2015-04-29 DIAGNOSIS — I872 Venous insufficiency (chronic) (peripheral): Secondary | ICD-10-CM

## 2015-04-29 DIAGNOSIS — K589 Irritable bowel syndrome without diarrhea: Secondary | ICD-10-CM

## 2015-04-29 DIAGNOSIS — M1990 Unspecified osteoarthritis, unspecified site: Secondary | ICD-10-CM

## 2015-04-29 DIAGNOSIS — Z9989 Dependence on other enabling machines and devices: Secondary | ICD-10-CM

## 2015-04-29 DIAGNOSIS — I25118 Atherosclerotic heart disease of native coronary artery with other forms of angina pectoris: Secondary | ICD-10-CM

## 2015-04-29 DIAGNOSIS — M5431 Sciatica, right side: Secondary | ICD-10-CM | POA: Insufficient documentation

## 2015-04-29 DIAGNOSIS — G43909 Migraine, unspecified, not intractable, without status migrainosus: Secondary | ICD-10-CM | POA: Insufficient documentation

## 2015-04-29 DIAGNOSIS — I517 Cardiomegaly: Secondary | ICD-10-CM

## 2015-04-29 DIAGNOSIS — E559 Vitamin D deficiency, unspecified: Secondary | ICD-10-CM

## 2015-04-29 DIAGNOSIS — G4733 Obstructive sleep apnea (adult) (pediatric): Secondary | ICD-10-CM

## 2015-04-29 DIAGNOSIS — K2100 Gastro-esophageal reflux disease with esophagitis, without bleeding: Secondary | ICD-10-CM | POA: Insufficient documentation

## 2015-04-29 HISTORY — DX: Atherosclerotic heart disease of native coronary artery with other forms of angina pectoris: I25.118

## 2015-04-29 HISTORY — DX: Unspecified osteoarthritis, unspecified site: M19.90

## 2015-04-29 HISTORY — DX: Arthropathic psoriasis, unspecified: L40.50

## 2015-04-29 HISTORY — DX: Obstructive sleep apnea (adult) (pediatric): G47.33

## 2015-04-29 HISTORY — DX: Vitamin D deficiency, unspecified: E55.9

## 2015-04-29 HISTORY — DX: Dependence on other enabling machines and devices: Z99.89

## 2015-04-29 HISTORY — DX: Migraine, unspecified, not intractable, without status migrainosus: G43.909

## 2015-04-29 HISTORY — DX: Cardiomegaly: I51.7

## 2015-04-29 HISTORY — DX: Irritable bowel syndrome, unspecified: K58.9

## 2015-04-29 HISTORY — DX: Sciatica, right side: M54.31

## 2015-04-29 HISTORY — DX: Venous insufficiency (chronic) (peripheral): I87.2

## 2015-04-29 HISTORY — DX: Gastro-esophageal reflux disease with esophagitis, without bleeding: K21.00

## 2015-05-18 DIAGNOSIS — L409 Psoriasis, unspecified: Secondary | ICD-10-CM

## 2015-05-18 HISTORY — DX: Psoriasis, unspecified: L40.9

## 2015-07-21 ENCOUNTER — Encounter: Payer: Self-pay | Admitting: Podiatry

## 2015-07-21 ENCOUNTER — Ambulatory Visit (INDEPENDENT_AMBULATORY_CARE_PROVIDER_SITE_OTHER): Payer: 59 | Admitting: Podiatry

## 2015-07-21 VITALS — BP 152/78 | HR 66 | Resp 14

## 2015-07-21 DIAGNOSIS — L600 Ingrowing nail: Secondary | ICD-10-CM

## 2015-07-21 DIAGNOSIS — L03031 Cellulitis of right toe: Secondary | ICD-10-CM | POA: Diagnosis not present

## 2015-07-21 MED ORDER — DOXYCYCLINE HYCLATE 100 MG PO TABS: 100.0000 mg | ORAL_TABLET | Freq: Two times a day (BID) | ORAL | Status: DC

## 2015-07-21 NOTE — Progress Notes (Signed)
   Subjective:    Patient ID: Lindsey Murphy, female    DOB: Jan 17, 1956, 59 y.o.   MRN: [id number]  HPI this patient presents to my office with chief complaint of painful second toe right foot. He states that her she was working on her toe herself and cut the outside border at an angle since that time. The toe has become more painful and increased redness on the outside border of the second toe right foot. No pus or drainage has been noted. She presents the office for an evaluation and treatment    Review of Systems  All other systems reviewed and are negative.      Objective:   Physical Exam GENERAL APPEARANCE: Alert, conversant. Appropriately groomed. No acute distress.  VASCULAR: Pedal pulses are  palpable at  [facility] and PT bilateral.  Capillary refill time is immediate to all digits,  Normal temperature gradient.  Digital hair growth is present bilateral  NEUROLOGIC: not performed due to stacking on foot/leg MUSCULOSKELETAL: acceptable muscle strength, tone and stability bilateral.  Intrinsic muscluature intact bilateral.  Rectus appearance of foot and digits noted bilateral.  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  DERMATOLOGIC: skin color, texture, and turgor are within normal limits.  No preulcerative lesions or ulcers  are seen, no interdigital maceration noted.  No open lesions present.  . No drainage noted.  NAIL  Pain redness and swelling lateral border second toe right foot.  Marked incurvation second toe right.         Assessment & Plan:  Paronychia lateral border second toe right foot. Prescribe cephalexin 500 mg  #15  One bid.  RTC prn.   Helane Gunther DPM

## 2015-07-21 NOTE — Addendum Note (Signed)
Addended by: Harlon Flor, Roanna Reaves L on: 07/21/2015 03:32 PM   Modules accepted: Orders, Medications

## 2015-07-21 NOTE — Addendum Note (Signed)
Addended by: Harlon Flor, Robi Dewolfe L on: 07/21/2015 04:52 PM   Modules accepted: Orders, Medications

## 2015-07-28 ENCOUNTER — Ambulatory Visit: Payer: 59 | Admitting: Podiatry

## 2015-10-05 DIAGNOSIS — I493 Ventricular premature depolarization: Secondary | ICD-10-CM

## 2015-10-05 HISTORY — DX: Ventricular premature depolarization: I49.3

## 2015-11-24 DIAGNOSIS — J301 Allergic rhinitis due to pollen: Secondary | ICD-10-CM

## 2015-11-24 HISTORY — DX: Allergic rhinitis due to pollen: J30.1

## 2015-12-29 ENCOUNTER — Encounter: Payer: Self-pay | Admitting: Cardiology

## 2016-01-01 DIAGNOSIS — R001 Bradycardia, unspecified: Secondary | ICD-10-CM | POA: Insufficient documentation

## 2016-01-01 HISTORY — DX: Bradycardia, unspecified: R00.1

## 2016-02-17 DIAGNOSIS — Z45018 Encounter for adjustment and management of other part of cardiac pacemaker: Secondary | ICD-10-CM | POA: Insufficient documentation

## 2016-02-17 HISTORY — DX: Encounter for adjustment and management of other part of cardiac pacemaker: Z45.018

## 2016-02-18 DIAGNOSIS — I495 Sick sinus syndrome: Secondary | ICD-10-CM

## 2016-02-18 DIAGNOSIS — Z950 Presence of cardiac pacemaker: Secondary | ICD-10-CM | POA: Insufficient documentation

## 2016-02-18 HISTORY — DX: Sick sinus syndrome: I49.5

## 2016-02-18 HISTORY — DX: Presence of cardiac pacemaker: Z95.0

## 2016-02-23 DIAGNOSIS — T82110A Breakdown (mechanical) of cardiac electrode, initial encounter: Secondary | ICD-10-CM

## 2016-02-23 HISTORY — DX: Breakdown (mechanical) of cardiac electrode, initial encounter: T82.110A

## 2016-06-25 DIAGNOSIS — I214 Non-ST elevation (NSTEMI) myocardial infarction: Secondary | ICD-10-CM | POA: Insufficient documentation

## 2016-06-25 DIAGNOSIS — R079 Chest pain, unspecified: Secondary | ICD-10-CM

## 2016-06-25 DIAGNOSIS — N3000 Acute cystitis without hematuria: Secondary | ICD-10-CM | POA: Insufficient documentation

## 2016-06-25 HISTORY — DX: Chest pain, unspecified: R07.9

## 2016-06-25 HISTORY — DX: Acute cystitis without hematuria: N30.00

## 2016-06-25 HISTORY — DX: Non-ST elevation (NSTEMI) myocardial infarction: I21.4

## 2016-07-06 DIAGNOSIS — I5032 Chronic diastolic (congestive) heart failure: Secondary | ICD-10-CM

## 2016-07-06 HISTORY — DX: Chronic diastolic (congestive) heart failure: I50.32

## 2016-07-22 DIAGNOSIS — I110 Hypertensive heart disease with heart failure: Secondary | ICD-10-CM | POA: Insufficient documentation

## 2016-07-22 HISTORY — DX: Hypertensive heart disease with heart failure: I11.0

## 2016-08-23 DIAGNOSIS — Z6841 Body Mass Index (BMI) 40.0 and over, adult: Secondary | ICD-10-CM

## 2016-08-23 HISTORY — DX: Body Mass Index (BMI) 40.0 and over, adult: Z684

## 2016-09-15 ENCOUNTER — Encounter: Payer: Self-pay | Admitting: Cardiology

## 2016-09-15 DIAGNOSIS — E785 Hyperlipidemia, unspecified: Secondary | ICD-10-CM | POA: Insufficient documentation

## 2016-09-15 DIAGNOSIS — I255 Ischemic cardiomyopathy: Secondary | ICD-10-CM | POA: Insufficient documentation

## 2016-09-15 DIAGNOSIS — I252 Old myocardial infarction: Secondary | ICD-10-CM | POA: Insufficient documentation

## 2016-09-15 DIAGNOSIS — R001 Bradycardia, unspecified: Secondary | ICD-10-CM

## 2016-09-15 DIAGNOSIS — Z950 Presence of cardiac pacemaker: Secondary | ICD-10-CM | POA: Insufficient documentation

## 2016-09-15 HISTORY — DX: Bradycardia, unspecified: R00.1

## 2016-09-15 HISTORY — DX: Ischemic cardiomyopathy: I25.5

## 2016-09-16 ENCOUNTER — Encounter: Payer: Self-pay | Admitting: Cardiology

## 2016-09-16 ENCOUNTER — Ambulatory Visit (INDEPENDENT_AMBULATORY_CARE_PROVIDER_SITE_OTHER): Payer: 59 | Admitting: Cardiology

## 2016-09-16 VITALS — BP 136/86 | HR 83 | Ht 64.0 in | Wt 240.8 lb

## 2016-09-16 DIAGNOSIS — Q245 Malformation of coronary vessels: Secondary | ICD-10-CM | POA: Diagnosis not present

## 2016-09-16 DIAGNOSIS — I255 Ischemic cardiomyopathy: Secondary | ICD-10-CM

## 2016-09-16 DIAGNOSIS — I493 Ventricular premature depolarization: Secondary | ICD-10-CM

## 2016-09-16 DIAGNOSIS — I5032 Chronic diastolic (congestive) heart failure: Secondary | ICD-10-CM | POA: Diagnosis not present

## 2016-09-16 DIAGNOSIS — Z950 Presence of cardiac pacemaker: Secondary | ICD-10-CM

## 2016-09-16 DIAGNOSIS — E785 Hyperlipidemia, unspecified: Secondary | ICD-10-CM

## 2016-09-16 MED ORDER — METOPROLOL SUCCINATE ER 25 MG PO TB24: 25.0000 mg | ORAL_TABLET | Freq: Two times a day (BID) | ORAL | 6 refills | Status: DC

## 2016-09-16 NOTE — Patient Instructions (Addendum)
Medication Instructions:  Your physician has recommended you make the following change in your medication: increase metoprolol to twice daily   Labwork: Your physician recommends that you return for lab work in: today. CMP, BNP, lipid   Testing/Procedures: None  Follow-Up: Your physician recommends that you schedule a follow-up appointment in: 3 months.   Any Other Special Instructions Will Be Listed Below (If Applicable).     If you need a refill on your cardiac medications before your next appointment, please call your pharmacy.    Heart Failure Heart failure is a condition in which the heart has trouble pumping blood because it has become weak or stiff. This means that the heart does not pump blood efficiently for the body to work well. For some people with heart failure, fluid may back up into the lungs and there may be swelling (edema) in the lower legs. Heart failure is usually a long-term (chronic) condition. It is important for you to take good care of yourself and follow the treatment plan from your health care provider. What are the causes? This condition is caused by some health problems, including:  High blood pressure (hypertension). Hypertension causes the heart muscle to work harder than normal. High blood pressure eventually causes the heart to become stiff and weak.  Coronary artery disease (CAD). CAD is the buildup of cholesterol and fat (plaques) in the arteries of the heart.  Heart attack (myocardial infarction). Injured tissue, which is caused by the heart attack, does not contract as well and the heart's ability to pump blood is weakened.  Abnormal heart valves. When the heart valves do not open and close properly, the heart muscle must pump harder to keep the blood flowing.  Heart muscle disease (cardiomyopathy or myocarditis). Heart muscle disease is damage to the heart muscle from a variety of causes, such as drug or alcohol abuse, infections, or unknown  causes. These can increase the risk of heart failure.  Lung disease. When the lungs do not work properly, the heart must work harder.  What increases the risk? Risk of heart failure increases as a person ages. This condition is also more likely to develop in people who:  Are overweight.  Are female.  Smoke or chew tobacco.  Abuse alcohol or illegal drugs.  Have taken medicines that can damage the heart, such as chemotherapy drugs.  Have diabetes. ? High blood sugar (glucose) is associated with high fat (lipid) levels in the blood. ? Diabetes can also damage tiny blood vessels that carry nutrients to the heart muscle.  Have abnormal heart rhythms.  Have thyroid problems.  Have low blood counts (anemia).  What are the signs or symptoms? Symptoms of this condition include:  Shortness of breath with activity, such as when climbing stairs.  Persistent cough.  Swelling of the feet, ankles, legs, or abdomen.  Unexplained weight gain.  Difficulty breathing when lying flat (orthopnea).  Waking from sleep because of the need to sit up and get more air.  Rapid heartbeat.  Fatigue and loss of energy.  Feeling light-headed, dizzy, or close to fainting.  Loss of appetite.  Nausea.  Increased urination during the night (nocturia).  Confusion.  How is this diagnosed? This condition is diagnosed based on:  Medical history, symptoms, and a physical exam.  Diagnostic tests, which may include: ? Echocardiogram. ? Electrocardiogram (ECG). ? Chest X-ray. ? Blood tests. ? Exercise stress test. ? Radionuclide scans. ? Cardiac catheterization and angiogram.  How is this treated? Treatment for  this condition is aimed at managing the symptoms of heart failure. Medicines, behavioral changes, or other treatments may be necessary to treat heart failure. Medicines These may include:  Angiotensin-converting enzyme (ACE) inhibitors. This type of medicine blocks the effects of  a blood protein called angiotensin-converting enzyme. ACE inhibitors relax (dilate) the blood vessels and help to lower blood pressure.  Angiotensin receptor blockers (ARBs). This type of medicine blocks the actions of a blood protein called angiotensin. ARBs dilate the blood vessels and help to lower blood pressure.  Water pills (diuretics). Diuretics cause the kidneys to remove salt and water from the blood. The extra fluid is removed through urination, leaving a lower volume of blood that the heart has to pump.  Beta blockers. These improve heart muscle strength and they prevent the heart from beating too quickly.  Digoxin. This increases the force of the heartbeat.  Healthy behavior changes These may include:  Reaching and maintaining a healthy weight.  Stopping smoking or chewing tobacco.  Eating heart-healthy foods.  Limiting or avoiding alcohol.  Stopping use of street drugs (illegal drugs).  Physical activity.  Other treatments These may include:  Surgery to open blocked coronary arteries or repair damaged heart valves.  Placement of a biventricular pacemaker to improve heart muscle function (cardiac resynchronization therapy). This device paces both the right ventricle and left ventricle.  Placement of a device to treat serious abnormal heart rhythms (implantable cardioverter defibrillator, or ICD).  Placement of a device to improve the pumping ability of the heart (left ventricular assist device, or LVAD).  Heart transplant. This can cure heart failure, and it is considered for certain patients who do not improve with other therapies.  Follow these instructions at home: Medicines  Take over-the-counter and prescription medicines only as told by your health care provider. Medicines are important in reducing the workload of your heart, slowing the progression of heart failure, and improving your symptoms. ? Do not stop taking your medicine unless your health care  provider told you to do that. ? Do not skip any dose of medicine. ? Refill your prescriptions before you run out of medicine. You need your medicines every day. Eating and drinking   Eat heart-healthy foods. Talk with a dietitian to make an eating plan that is right for you. ? Choose foods that contain no trans fat and are low in saturated fat and cholesterol. Healthy choices include fresh or frozen fruits and vegetables, fish, lean meats, legumes, fat-free or low-fat dairy products, and whole-grain or high-fiber foods. ? Limit salt (sodium) if directed by your health care provider. Sodium restriction may reduce symptoms of heart failure. Ask a dietitian to recommend heart-healthy seasonings. ? Use healthy cooking methods instead of frying. Healthy methods include roasting, grilling, broiling, baking, poaching, steaming, and stir-frying.  Limit your fluid intake if directed by your health care provider. Fluid restriction may reduce symptoms of heart failure. Lifestyle  Stop smoking or using chewing tobacco. Nicotine and tobacco can damage your heart and your blood vessels. Do not use nicotine gum or patches before talking to your health care provider.  Limit alcohol intake to no more than 1 drink per day for non-pregnant women and 2 drinks per day for men. One drink equals 12 oz of beer, 5 oz of wine, or 1 oz of hard liquor. ? Drinking more than that is harmful to your heart. Tell your health care provider if you drink alcohol several times a week. ? Talk with your health care  provider about whether any level of alcohol use is safe for you. ? If your heart has already been damaged by alcohol or you have severe heart failure, drinking alcohol should be stopped completely.  Stop use of illegal drugs.  Lose weight if directed by your health care provider. Weight loss may reduce symptoms of heart failure.  Do moderate physical activity if directed by your health care provider. People who are  elderly and people with severe heart failure should consult with a health care provider for physical activity recommendations. Monitor important information  Weigh yourself every day. Keeping track of your weight daily helps you to notice excess fluid sooner. ? Weigh yourself every morning after you urinate and before you eat breakfast. ? Wear the same amount of clothing each time you weigh yourself. ? Record your daily weight. Provide your health care provider with your weight record.  Monitor and record your blood pressure as told by your health care provider.  Check your pulse as told by your health care provider. Dealing with extreme temperatures  If the weather is extremely hot: ? Avoid vigorous physical activity. ? Use air conditioning or fans or seek a cooler location. ? Avoid caffeine and alcohol. ? Wear loose-fitting, lightweight, and light-colored clothing.  If the weather is extremely cold: ? Avoid vigorous physical activity. ? Layer your clothes. ? Wear mittens or gloves, a hat, and a scarf when you go outside. ? Avoid alcohol. General instructions  Manage other health conditions such as hypertension, diabetes, thyroid disease, or abnormal heart rhythms as told by your health care provider.  Learn to manage stress. If you need help to do this, ask your health care provider.  Plan rest periods when fatigued.  Get ongoing education and support as needed.  Participate in or seek rehabilitation as needed to maintain or improve independence and quality of life.  Stay up to date with immunizations. Keeping current on pneumococcal and influenza immunizations is especially important to prevent respiratory infections.  Keep all follow-up visits as told by your health care provider. This is important. Contact a health care provider if:  You have a rapid weight gain.  You have increasing shortness of breath that is unusual for you.  You are unable to participate in your  usual physical activities.  You tire easily.  You cough more than normal, especially with physical activity.  You have any swelling or more swelling in areas such as your hands, feet, ankles, or abdomen.  You are unable to sleep because it is hard to breathe.  You feel like your heart is beating quickly (palpitations).  You become dizzy or light-headed when you stand up. Get help right away if:  You have difficulty breathing.  You notice or your family notices a change in your awareness, such as having trouble staying awake or having difficulty with concentration.  You have pain or discomfort in your chest.  You have an episode of fainting (syncope). This information is not intended to replace advice given to you by your health care provider. Make sure you discuss any questions you have with your health care provider. Document Released: 03/01/2005 Document Revised: 11/04/2015 Document Reviewed: 09/24/2015 Elsevier Interactive Patient Education  2017 [employer].

## 2016-09-16 NOTE — Progress Notes (Signed)
Murphy Office Note:    Date:  09/16/2016   ID:  Lindsey Murphy, DOB [birth year], MRN [id number]  PCP:  Lindsey Cherry, MD  Cardiologist:  Lindsey Herrlich, MD    Referring MD: Lindsey Cherry, MD    ASSESSMENT:    1. Congenital anomaly of coronary artery   2. Chronic diastolic congestive heart failure (HCC)   3. Frequent PVCs   4. Ischemic cardiomyopathy   5. Pacemaker   6. Hyperlipidemia, unspecified hyperlipidemia type    PLAN:    In order of problems listed above:  1. She has been extensively evaluated no recommendation for surgical intervention, if she underwent repeat coronary angiography I like to see a flow wire placed in the anomalous left circumflex to assess her physiologically. At this time I perform no further diagnostic testing. 2. Stable compensated continue current diuretic plan her next visit to recheck her ejection fraction mildly reduced. 3. Continues to be symptomatic, requested repeatedly F download from her last pacemaker check to quantitate her PVC burden and will increase the dose of her beta blocker. She is on a loop diuretic is at risk for hypokalemia and labs ordered. 4. Stable continue current medical treatment including beta blocker and recheck ejection fraction at next visit. 5. Stable continue to follow in device clinic Lindsey Murphy. 6. Stable continue her statin will check liver function for toxicity and lipid profile for efficacy today.  Next appointment: 3 months   Medication Adjustments/Labs and Tests Ordered: Current medicines are reviewed at length with the patient today.  Concerns regarding medicines are outlined above.  No orders of the defined types were placed in this encounter.  No orders of the defined types were placed in this encounter.   Chief Complaint  Patient presents with  . Follow-up    Routine flup appt   . Shortness of Breath    when lying flat  . Irregular Heart Beat    pt has c/o PVC's when exercising     History of Present Illness:    Lindsey Murphy is a 61 y.o. 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 She was referred to [facility] for evaluation of her anomalous coronary vessel seen by Murphy EP cardiothoracic surgery has had a CT and MRI performed and a decision was made that there was no flow limitation and no indication for surgical intervention. She is frustrated disappointed but improved having no angina continues to exercise in cardiac rehabilitation but still has palpitations has me she has frequent PVCs during her exercise program. No orthopnea edema syncope or TIA.  Compliance with diet, lifestyle and medications: Yes Past Medical History:  Diagnosis Date  . Arthritis   . GERD (gastroesophageal reflux disease)   . Heart murmur   . Hx of Bell's palsy   . Hypertension   . Myocardial infarction (HCC)    12/2012 - vasospasms   . Peripheral vascular disease (HCC)   . Pink eye disease of left eye    diagnosed 12/06/2014   . Pneumonia    hx of pneumonia   . Sleep apnea    cpap- does not know settings    Past Surgical History:  Procedure Laterality Date  . acterior cervical fusion     . left eye duct eye surgery     . right knee surgery     .  TOTAL HIP ARTHROPLASTY Left 12/17/2014   Procedure: LEFT TOTAL HIP ARTHROPLASTY ANTERIOR APPROACH;  Surgeon: Durene Romans, MD;  Location: [facility];  Service: Orthopedics;  Laterality: Left;    Current Medications: Current Meds  Medication Sig  . amLODipine (NORVASC) 2.5 MG tablet Take 2.5 mg by mouth daily.   Marland Kitchen atorvastatin (LIPITOR) 10 MG tablet Take 10 mg by mouth daily.  Marland Kitchen azelastine (ASTELIN) 0.1 % nasal spray Place into both nostrils 2 (two) times daily. Use in each nostril as directed  . Calcium Carb-Cholecalciferol (CALCIUM 600+D3) 600-800  MG-UNIT TABS Take 1 tablet by mouth 2 (two) times daily.  . cholecalciferol (VITAMIN D) 1000 UNITS tablet Take 1,000 Units by mouth daily.  . clopidogrel (PLAVIX) 75 MG tablet Take 75 mg by mouth.  . docusate sodium (COLACE) 100 MG capsule Take 1 capsule (100 mg total) by mouth 2 (two) times daily.  . fluticasone (FLONASE) 50 MCG/ACT nasal spray Place 2 sprays into both nostrils daily. Bedtime  . furosemide (LASIX) 20 MG tablet Take 20 mg by mouth 2 (two) times daily.  Marland Kitchen glucosamine-chondroitin (GLUCOSAMINE-CHONDROITIN DS) 500-400 MG tablet Take by mouth.  Marland Kitchen guaiFENesin (MUCINEX) 600 MG 12 hr tablet Take 600 mg by mouth daily.   Marland Kitchen loratadine (CLARITIN) 10 MG tablet Take 10 mg by mouth daily.  . metoprolol succinate (TOPROL-XL) 25 MG 24 hr tablet Take 25 mg by mouth daily.  . montelukast (SINGULAIR) 10 MG tablet Take 10 mg by mouth at bedtime.  . Multiple Vitamin (MULTIVITAMIN WITH MINERALS) TABS tablet Take 1 tablet by mouth daily.  . nitroGLYCERIN (NITROSTAT) 0.4 MG SL tablet Place 0.4 mg under the tongue every 5 (five) minutes as needed for chest pain.  . Omega-3 Fatty Acids (SALMON OIL-1000 PO) Take 1 tablet by mouth 2 (two) times daily.   . pantoprazole (PROTONIX) 40 MG tablet Take 40 mg by mouth 2 (two) times daily.  . potassium chloride (K-DUR) 10 MEQ tablet Take 10 mEq by mouth daily.  . Secukinumab (COSENTYX 300 DOSE [place]) Inject into the skin. Every 4 weeks,am  . vitamin C (ASCORBIC ACID) 500 MG tablet Take 500 mg by mouth daily.  . Wheat Dextrin (BENEFIBER DRINK MIX PO) Take 1 scoop by mouth daily.     Allergies:   Penicillins; Polymyxin b-trimethoprim; Etanercept; Lovastatin er [lovastatin]; Meloxicam; Other; and Tape   Social History   Social History  . Marital status: Married    Spouse name: N/A  . Number of children: N/A  . Years of education: N/A   Social History Main Topics  . Smoking status: Never Smoker  . Smokeless tobacco: Never Used  . Alcohol use None  . Drug  use: No  . Sexual activity: Not Asked   Other Topics Concern  . None   Social History Narrative  . None     Family History: The patient's family history is not on file. ROS:   Please see the history of present illness.    All other systems reviewed and are negative.  EKGs/Labs/Other Studies Reviewed:    The following studies were reviewed today:   Recent Labs: No results found for requested labs within last 8760 hours.  Recent Lipid Panel No results found for: CHOL, TRIG, HDL, CHOLHDL, VLDL, LDLCALC, LDLDIRECT  Physical Exam:    VS:  BP 136/86   Pulse 83   Ht 5\' 4"  (1.626 m)   Wt 240 lb 12.8 oz (109.2 kg)   SpO2 97%   BMI 41.33 kg/m  Wt Readings from Last 3 Encounters:  09/16/16 240 lb 12.8 oz (109.2 kg)  12/17/14 226 lb (102.5 kg)  12/10/14 226 lb (102.5 kg)     GEN:  Well nourished, well developed in no acute distress HEENT: Normal NECK: No JVD; No carotid bruits LYMPHATICS: No lymphadenopathy CARDIAC: RRR, no murmurs, rubs, gallops RESPIRATORY:  Clear to auscultation without rales, wheezing or rhonchi  ABDOMEN: Soft, non-tender, non-distended MUSCULOSKELETAL:  No edema; No deformity  SKIN: Warm and dry NEUROLOGIC:  Alert and oriented x 3 PSYCHIATRIC:  Normal affect    Signed, Lindsey Herrlich, MD  09/16/2016 9:50 AM    [facility]

## 2016-09-22 LAB — COMPREHENSIVE METABOLIC PANEL
A/G RATIO: 1.7 (ref 1.2–2.2)
ALK PHOS: 96 IU/L (ref 39–117)
ALT: 29 IU/L (ref 0–32)
AST: 33 IU/L (ref 0–40)
Albumin: 4.6 g/dL (ref 3.5–5.5)
BILIRUBIN TOTAL: 0.6 mg/dL (ref 0.0–1.2)
BUN / CREAT RATIO: 28 — AB (ref 9–23)
BUN: 24 mg/dL (ref 6–24)
CHLORIDE: 100 mmol/L (ref 96–106)
CO2: 24 mmol/L (ref 20–29)
Calcium: 9.9 mg/dL (ref 8.7–10.2)
Creatinine, Ser: 0.86 mg/dL (ref 0.57–1.00)
GFR calc non Af Amer: 74 mL/min/{1.73_m2} (ref >59)
GFR, EST AFRICAN AMERICAN: 86 mL/min/{1.73_m2} (ref >59)
Globulin, Total: 2.7 g/dL (ref 1.5–4.5)
Glucose: 101 mg/dL — ABNORMAL HIGH (ref 65–99)
POTASSIUM: 4.8 mmol/L (ref 3.5–5.2)
Sodium: 142 mmol/L (ref 134–144)
TOTAL PROTEIN: 7.3 g/dL (ref 6.0–8.5)

## 2016-09-22 LAB — BRAIN NATRIURETIC PEPTIDE

## 2016-09-22 LAB — CHOLESTEROL, TOTAL: Cholesterol, Total: 169 mg/dL (ref 100–199)

## 2016-10-14 ENCOUNTER — Other Ambulatory Visit: Payer: Self-pay

## 2016-10-14 ENCOUNTER — Telehealth: Payer: Self-pay

## 2016-10-14 DIAGNOSIS — I493 Ventricular premature depolarization: Secondary | ICD-10-CM

## 2016-10-14 MED ORDER — CLOPIDOGREL BISULFATE 75 MG PO TABS: 75.0000 mg | ORAL_TABLET | Freq: Every day | ORAL | 3 refills | Status: DC

## 2016-10-14 MED ORDER — AMLODIPINE BESYLATE 2.5 MG PO TABS: 2.5000 mg | ORAL_TABLET | Freq: Every day | ORAL | 3 refills | Status: DC

## 2016-10-14 MED ORDER — METOPROLOL SUCCINATE ER 25 MG PO TB24: 25.0000 mg | ORAL_TABLET | Freq: Two times a day (BID) | ORAL | 3 refills | Status: DC

## 2016-10-14 MED ORDER — ATORVASTATIN CALCIUM 10 MG PO TABS: 10.0000 mg | ORAL_TABLET | Freq: Every day | ORAL | 3 refills | Status: DC

## 2016-10-14 NOTE — Telephone Encounter (Signed)
Refill sent.

## 2016-12-01 IMAGING — CR DG HIP (WITH OR WITHOUT PELVIS) 1V PORT*L*
1 series · 2 of 2 positions shown · non-contrast
Comparison: None.

CLINICAL DATA: Osteoarthritis of the left hip.

EXAM:
DG C-ARM 1-60 MIN-NO REPORT; DG HIP (WITH OR WITHOUT PELVIS) 1V PORT
LEFT

[Series 1: AP · U · 2 of 2 slices shown]
[im 1/2]
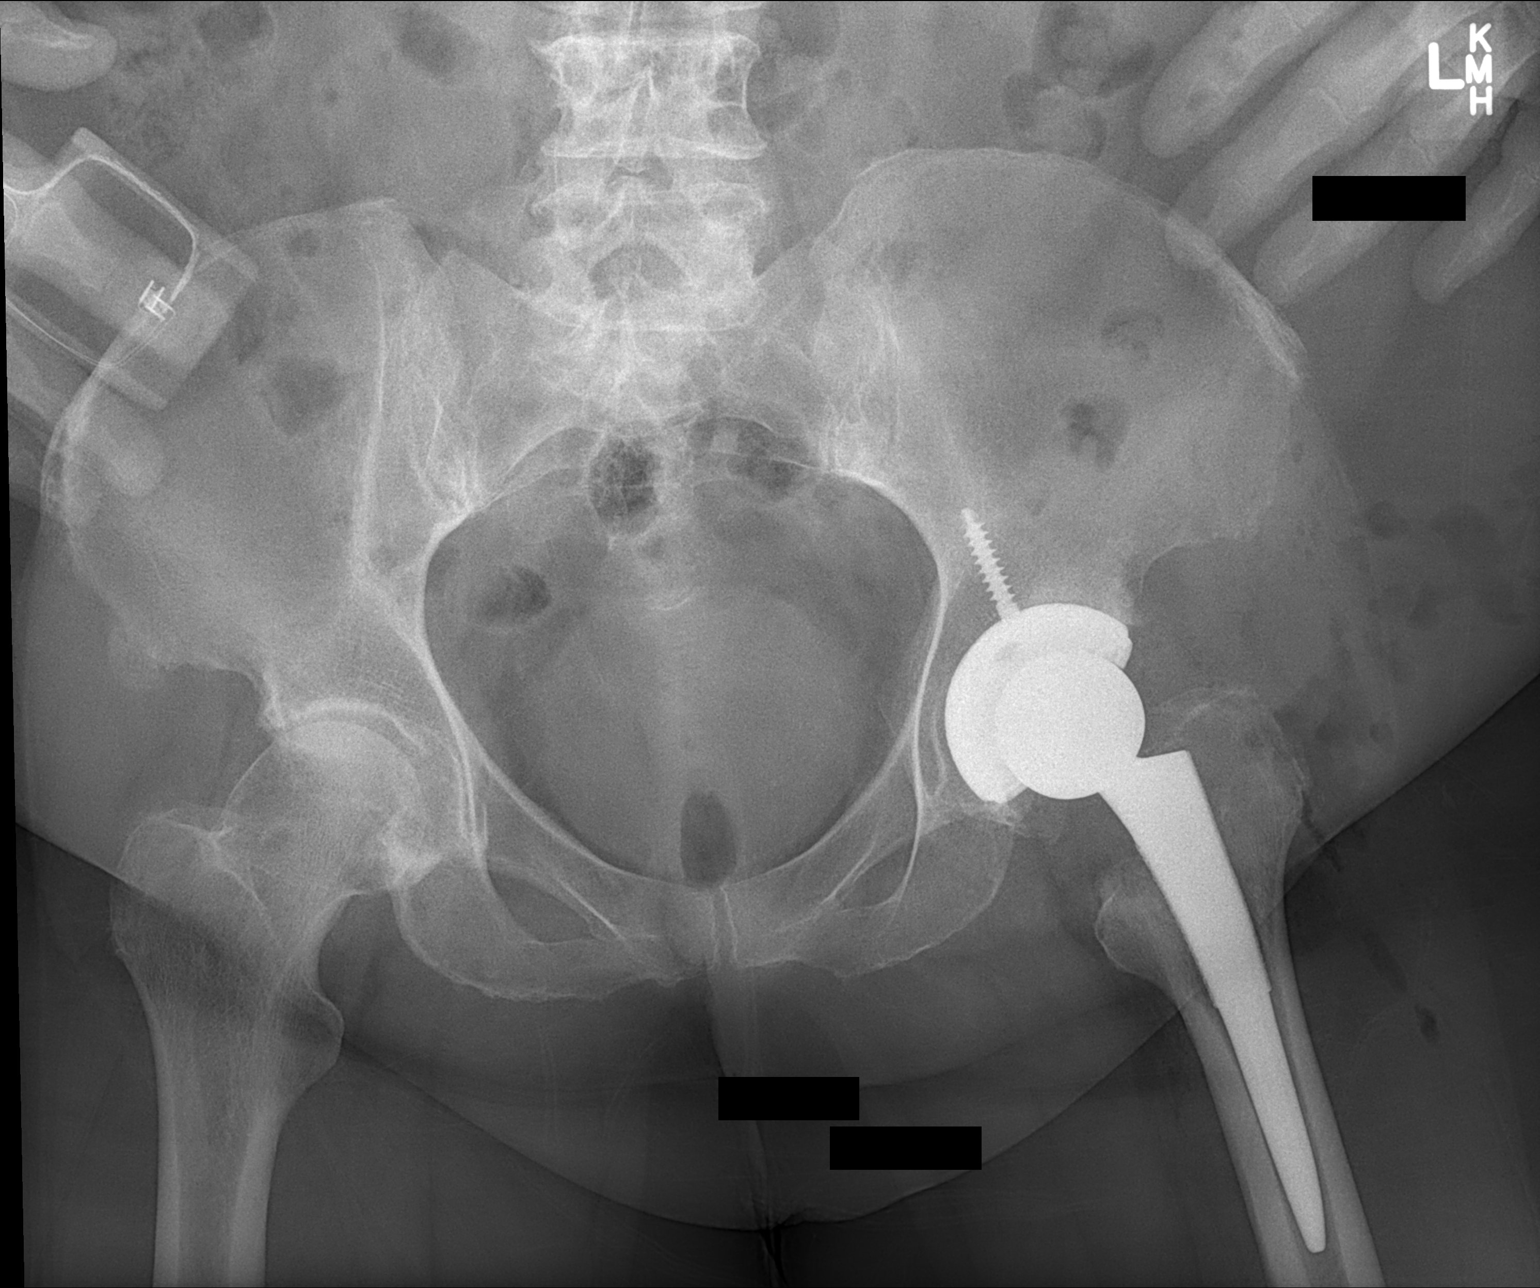
[im 2/2]
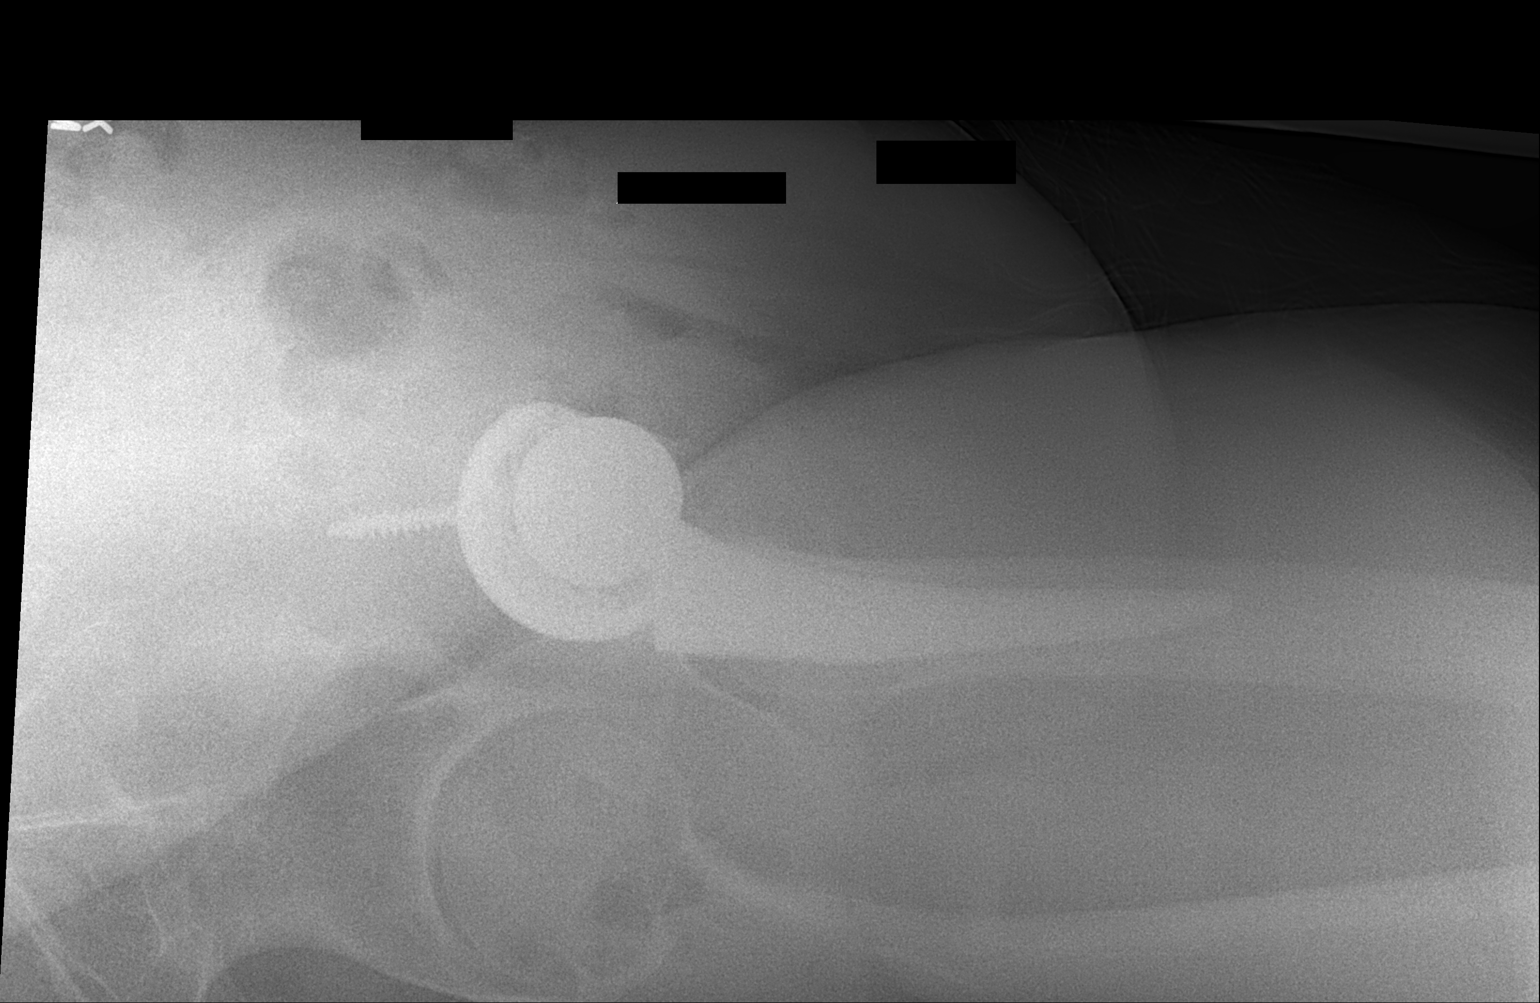

[2 of 2 positions shown; findings below may reference images not displayed]

FINDINGS: The patient has had a left total hip prosthesis inserted. Acetabular
and femoral components appear in good position in the AP and lateral
projections. No fractures. Slight arthritis of the right hip.
IMPRESSION: Satisfactory appearance of the left hip after total hip prosthesis
insertion.

## 2016-12-16 ENCOUNTER — Telehealth: Payer: Self-pay

## 2016-12-16 NOTE — Telephone Encounter (Signed)
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 Can we get that for her?

## 2016-12-16 NOTE — Telephone Encounter (Signed)
YES

## 2016-12-23 ENCOUNTER — Ambulatory Visit: Payer: 59 | Admitting: Cardiology

## 2016-12-28 ENCOUNTER — Encounter: Payer: Self-pay | Admitting: Internal Medicine

## 2016-12-28 ENCOUNTER — Encounter: Payer: Self-pay | Admitting: Cardiology

## 2016-12-28 ENCOUNTER — Ambulatory Visit (INDEPENDENT_AMBULATORY_CARE_PROVIDER_SITE_OTHER): Payer: 59 | Admitting: Cardiology

## 2016-12-28 VITALS — BP 130/90 | HR 90 | Ht 64.0 in | Wt 244.4 lb

## 2016-12-28 DIAGNOSIS — Q245 Malformation of coronary vessels: Secondary | ICD-10-CM | POA: Diagnosis not present

## 2016-12-28 DIAGNOSIS — I493 Ventricular premature depolarization: Secondary | ICD-10-CM

## 2016-12-28 DIAGNOSIS — I5032 Chronic diastolic (congestive) heart failure: Secondary | ICD-10-CM | POA: Diagnosis not present

## 2016-12-28 DIAGNOSIS — Z950 Presence of cardiac pacemaker: Secondary | ICD-10-CM | POA: Diagnosis not present

## 2016-12-28 DIAGNOSIS — E785 Hyperlipidemia, unspecified: Secondary | ICD-10-CM

## 2016-12-28 NOTE — Patient Instructions (Addendum)
Medication Instructions:  Your physician recommends that you continue on your current medications as directed. Please refer to the Current Medication list given to you today.   Labwork: Your physician recommends that you have lab work today: CMP and Lipids  Testing/Procedures: none  Follow-Up: Your physician wants you to follow-up in: 4 months You will receive a reminder letter in the mail two months in advance. If you don't receive a letter, please call our office to schedule the follow-up appointment.   You will receive a phone call to be scheduled with Dr Johney Frame.   Any Other Special Instructions Will Be Listed Below (If Applicable).     If you need a refill on your cardiac medications before your next appointment, please call your pharmacy.  Heart Failure  Weigh yourself every morning when you first wake up and record on a calender or note pad, bring this to your office visits. Using a pill tender can help with taking your medications consistently.  Limit your fluid intake to 2 liters daily  Limit your sodium intake to less than 2-3 grams daily. Ask if you need dietary teaching.  If you gain more than 3 pounds (from your dry weight ), double your dose of diuretic for the day.  If you gain more than 5 pounds (from your dry weight), double your dose of lasix and call your heart failure doctor.  Please do not smoke tobacco since it is very bad for your heart.  Please do not drink alcohol since it can worsen your heart failure.Also avoid OTC nonsteroidal drugs, such as advil, aleve and motrin.  Try to exercise for at least 30 minutes every day because this will help your heart be more efficient. You may be eligible for supervised cardiac rehab, ask your physician.

## 2016-12-28 NOTE — Telephone Encounter (Signed)
Letters written for pt at appt on 12-28-16, and given to pt.

## 2016-12-28 NOTE — Progress Notes (Signed)
Cardiology Office Note:    Date:  12/28/2016   ID:  Lindsey Murphy, DOB 12-18-1955, MRN [id number]  PCP:  Everlean Cherry, MD  Cardiologist:  Norman Herrlich, MD    Referring MD: Everlean Cherry, MD    ASSESSMENT:    1. Chronic diastolic congestive heart failure (HCC)   2. Frequent PVCs   3. Pacemaker   4. Congenital anomaly of coronary artery   5. Hyperlipidemia, unspecified hyperlipidemia type    PLAN:    In order of problems listed above:  1. Stable  2. continue her current dose of loop  Diuretic 3. Stable continue her beta blocker and at this time I don't think she requires an antiarrhythmic drug 4. Referral to EP for device care she requests Dr. Johney Frame 5. Stable continue medical treatment clopidogrel beta blocker and high intensity statin 6. Stable continue her statin check liver function for safety and lipid profile for efficacy   Next appointment: 6 months   Medication Adjustments/Labs and Tests Ordered: Current medicines are reviewed at length with the patient today.  Concerns regarding medicines are outlined above.  Orders Placed This Encounter  Procedures  . Lipid Profile  . Comprehensive Metabolic Panel (CMET)  . Ambulatory referral to Cardiac Electrophysiology   No orders of the defined types were placed in this encounter.   Chief Complaint  Patient presents with  . Follow-up    3 month flup appt   . Edema  . Congestive Heart Failure  . Coronary Artery Disease  . Hypertension  . Hyperlipidemia    History of Present Illness:    Lindsey Murphy is a 61 y.o. 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 She was referred to [facility] for evaluation of her anomalous coronary vessel seen by cardiology EP cardiothoracic surgery has had a CT and MRI performed and a decision was  made that there was no flow limitation and no indication for surgical intervention. last seen in July 2018. He has had no  Angina. Her weight is stable no edema orthopnea PND. She's had no pacemaker awareness or syncope. She continues to be limited in her activities she is short of breath walking 1/4 mile and doing light housework. She is finished cardiac rehabilitation. Compliance with diet, lifestyle and medications: Yes Past Medical History:  Diagnosis Date  . Arthritis   . GERD (gastroesophageal reflux disease)   . Heart murmur   . Hx of Bell's palsy   . Hypertension   . Myocardial infarction (HCC)    12/2012 - vasospasms   . Peripheral vascular disease (HCC)   . Pink eye disease of left eye    diagnosed 12/06/2014   . Pneumonia    hx of pneumonia   . Sleep apnea    cpap- does not know settings    Past Surgical History:  Procedure Laterality Date  . acterior cervical fusion     . left eye duct eye surgery     . right knee surgery     . TOTAL HIP ARTHROPLASTY Left 12/17/2014   Procedure: LEFT TOTAL HIP ARTHROPLASTY ANTERIOR APPROACH;  Surgeon: Durene Romans, MD;  Location: [facility];  Service: Orthopedics;  Laterality: Left;    Current Medications: Current Meds  Medication Sig  . amLODipine (NORVASC) 2.5 MG tablet Take 1 tablet (2.5 mg  total) by mouth daily.  Marland Kitchen atorvastatin (LIPITOR) 10 MG tablet Take 1 tablet (10 mg total) by mouth daily.  Marland Kitchen azelastine (ASTELIN) 0.1 % nasal spray Place into both nostrils 2 (two) times daily. Use in each nostril as directed  . Calcium Carb-Cholecalciferol (CALCIUM 600+D3) 600-800 MG-UNIT TABS Take 1 tablet by mouth 2 (two) times daily.  . cholecalciferol (VITAMIN D) 1000 UNITS tablet Take 1,000 Units by mouth daily.  . clopidogrel (PLAVIX) 75 MG tablet Take 1 tablet (75 mg total) by mouth daily.  . fluticasone (FLONASE) 50 MCG/ACT nasal spray Place 2 sprays into both nostrils daily. Bedtime  . furosemide (LASIX) 20 MG tablet Take 20 mg by mouth 2  (two) times daily.  Marland Kitchen glucosamine-chondroitin (GLUCOSAMINE-CHONDROITIN DS) 500-400 MG tablet Take 1 tablet by mouth 2 (two) times daily.   Marland Kitchen guaiFENesin (MUCINEX) 600 MG 12 hr tablet Take 600 mg by mouth daily.   Marland Kitchen loratadine (CLARITIN) 10 MG tablet Take 10 mg by mouth daily.  . metoprolol succinate (TOPROL-XL) 25 MG 24 hr tablet Take 1 tablet (25 mg total) by mouth 2 (two) times daily.  . montelukast (SINGULAIR) 10 MG tablet Take 10 mg by mouth at bedtime.  . Multiple Vitamin (MULTIVITAMIN WITH MINERALS) TABS tablet Take 1 tablet by mouth daily.  . nitroGLYCERIN (NITROSTAT) 0.4 MG SL tablet Place 0.4 mg under the tongue every 5 (five) minutes as needed for chest pain.  . Omega-3 Fatty Acids (SALMON OIL-1000 PO) Take 1 tablet by mouth 2 (two) times daily.   . pantoprazole (PROTONIX) 40 MG tablet Take 40 mg by mouth 2 (two) times daily.  . potassium chloride (K-DUR) 10 MEQ tablet Take 10 mEq by mouth daily.  . Secukinumab (COSENTYX 300 DOSE [place]) Inject into the skin. Every 4 weeks,am  . vitamin C (ASCORBIC ACID) 500 MG tablet Take 500 mg by mouth daily.  . Wheat Dextrin (BENEFIBER DRINK MIX PO) Take 1 scoop by mouth daily.     Allergies:   Penicillins; Polymyxin b-trimethoprim; Etanercept; Lovastatin er [lovastatin]; Meloxicam; Other; and Tape   Social History   Social History  . Marital status: Married    Spouse name: N/A  . Number of children: N/A  . Years of education: N/A   Social History Main Topics  . Smoking status: Never Smoker  . Smokeless tobacco: Never Used  . Alcohol use No  . Drug use: No  . Sexual activity: Not on file   Other Topics Concern  . Not on file   Social History Narrative  . No narrative on file     Family History: The patient's family history includes Heart attack in her maternal grandmother; Heart block in her mother; Stroke in her mother; Valvular heart disease in her mother. ROS:   Please see the history of present illness.    All other systems  reviewed and are negative.  EKGs/Labs/Other Studies Reviewed:    The following studies were reviewed today:   Recent Labs: 09/16/2016: ALT 29; BNP CANCELED; BUN 24; Creatinine, Ser 0.86; Potassium 4.8; Sodium 142  Recent Lipid Panel    Component Value Date/Time   CHOL 169 09/16/2016 1024    Physical Exam:    VS:  BP 130/90 (BP Location: Right Arm, Patient Position: Sitting)   Pulse 90   Ht 5\' 4"  (1.626 m)   Wt 244 lb 6.4 oz (110.9 kg)   SpO2 98%   BMI 41.95 kg/m     Wt Readings from Last 3 Encounters:  12/28/16  244 lb 6.4 oz (110.9 kg)  09/16/16 240 lb 12.8 oz (109.2 kg)  12/17/14 226 lb (102.5 kg)     GEN:   Well nourished, well developed in no acute distress HEENT: Normal NECK: No JVD; No carotid bruits LYMPHATICS: No lymphadenopathy CARDIAC: RRR, no murmurs, rubs, gallops RESPIRATORY:  Clear to auscultation without rales, wheezing or rhonchi  ABDOMEN: Soft, non-tender, non-distended MUSCULOSKELETAL:  No edema; No deformity  SKIN: Warm and dry NEUROLOGIC:  Alert and oriented x 3 PSYCHIATRIC:  Normal affect    Signed, Norman Herrlich, MD  12/28/2016 11:03 AM    [facility]

## 2016-12-29 LAB — LIPID PANEL
CHOL/HDL RATIO: 3.1 ratio (ref 0.0–4.4)
Cholesterol, Total: 175 mg/dL (ref 100–199)
HDL: 57 mg/dL (ref >39)
LDL CALC: 88 mg/dL (ref 0–99)
Triglycerides: 148 mg/dL (ref 0–149)
VLDL CHOLESTEROL CAL: 30 mg/dL (ref 5–40)

## 2016-12-29 LAB — COMPREHENSIVE METABOLIC PANEL
A/G RATIO: 1.6 (ref 1.2–2.2)
ALK PHOS: 97 IU/L (ref 39–117)
ALT: 35 IU/L — AB (ref 0–32)
AST: 39 IU/L (ref 0–40)
Albumin: 4.7 g/dL (ref 3.5–5.5)
BILIRUBIN TOTAL: 1.1 mg/dL (ref 0.0–1.2)
BUN/Creatinine Ratio: 21 (ref 9–23)
BUN: 19 mg/dL (ref 6–24)
CALCIUM: 10 mg/dL (ref 8.7–10.2)
CHLORIDE: 102 mmol/L (ref 96–106)
CO2: 25 mmol/L (ref 20–29)
Creatinine, Ser: 0.92 mg/dL (ref 0.57–1.00)
GFR calc Af Amer: 79 mL/min/{1.73_m2} (ref >59)
GFR calc non Af Amer: 68 mL/min/{1.73_m2} (ref >59)
Globulin, Total: 2.9 g/dL (ref 1.5–4.5)
Glucose: 107 mg/dL — ABNORMAL HIGH (ref 65–99)
POTASSIUM: 3.9 mmol/L (ref 3.5–5.2)
Sodium: 142 mmol/L (ref 134–144)
Total Protein: 7.6 g/dL (ref 6.0–8.5)

## 2017-01-18 ENCOUNTER — Encounter: Payer: 59 | Admitting: Cardiology

## 2017-01-19 ENCOUNTER — Ambulatory Visit: Payer: 59 | Admitting: Cardiology

## 2017-01-19 ENCOUNTER — Encounter: Payer: Self-pay | Admitting: Cardiology

## 2017-01-19 VITALS — BP 124/88 | HR 79 | Ht 64.0 in | Wt 243.8 lb

## 2017-01-19 DIAGNOSIS — Q245 Malformation of coronary vessels: Secondary | ICD-10-CM | POA: Diagnosis not present

## 2017-01-19 DIAGNOSIS — I493 Ventricular premature depolarization: Secondary | ICD-10-CM

## 2017-01-19 DIAGNOSIS — E785 Hyperlipidemia, unspecified: Secondary | ICD-10-CM

## 2017-01-19 MED ORDER — FLECAINIDE ACETATE 100 MG PO TABS: 100.0000 mg | ORAL_TABLET | Freq: Two times a day (BID) | ORAL | 3 refills | Status: DC

## 2017-01-19 NOTE — Patient Instructions (Addendum)
Medication Instructions:  Your physician has recommended you make the following change in your medication:   1.) Start Flecainide 100 mg TWICE daily - YOU WILL START THIS MEDICATION 7-10 DAYS PRIOR TO STRESS TEST  -- If you need a refill on your cardiac medications before your next appointment, please call your pharmacy. --  Labwork: None ordered  Testing/Procedures: Your physician has requested that you have an exercise tolerance test at least 10 days from now  For further information please visit [email or web address] Please also follow instruction sheet, as given.    Follow-Up: Your physician wants you to follow-up in: 3 months with Dr. Elberta Fortis.    Remote monitoring is used to monitor your Pacemaker  from home. This monitoring reduces the number of office visits required to check your device to one time per year. It allows [place] to keep an eye on the functioning of your device to ensure it is working properly. You are scheduled for a device check from home on 04/20/2017. You may send your transmission at any time that day. If you have a wireless device, the transmission will be sent automatically. After your physician reviews your transmission, you will receive a postcard with your next transmission date.    Thank you for choosing [facility]!!   Sigurd Sos, RN [phone]  Any Other Special Instructions Will Be Listed Below (If Applicable). Flecainide tablets What is this medicine? FLECAINIDE (FLEK a nide) is an antiarrhythmic drug. This medicine is used to prevent irregular heart rhythm. It can also slow down fast heartbeats called tachycardia. This medicine may be used for other purposes; ask your health care provider or pharmacist if you have questions. COMMON BRAND NAME(S): Tambocor What should I tell my health care provider before I take this medicine? They need to know if you have any of these conditions: -abnormal levels of potassium in the blood -heart disease  including heart rhythm and heart rate problems -kidney or liver disease -recent heart attack -an unusual or allergic reaction to flecainide, local anesthetics, other medicines, foods, dyes, or preservatives -pregnant or trying to get pregnant -breast-feeding How should I use this medicine? Take this medicine by mouth with a glass of water. Follow the directions on the prescription label. You can take this medicine with or without food. Take your doses at regular intervals. Do not take your medicine more often than directed. Do not stop taking this medicine suddenly. This may cause serious, heart-related side effects. If your doctor wants you to stop the medicine, the dose may be slowly lowered over time to avoid any side effects. Talk to your pediatrician regarding the use of this medicine in children. While this drug may be prescribed for children as young as 1 year of age for selected conditions, precautions do apply. Overdosage: If you think you have taken too much of this medicine contact a poison control center or emergency room at once. NOTE: This medicine is only for you. Do not share this medicine with others. What if I miss a dose? If you miss a dose, take it as soon as you can. If it is almost time for your next dose, take only that dose. Do not take double or extra doses. What may interact with this medicine? Do not take this medicine with any of the following medications: -amoxapine -arsenic trioxide -certain antibiotics like clarithromycin, erythromycin, gatifloxacin, gemifloxacin, levofloxacin, moxifloxacin, sparfloxacin, or troleandomycin -certain antidepressants called tricyclic antidepressants like amitriptyline, imipramine, or nortriptyline -certain medicines to control heart rhythm like  disopyramide, dofetilide, encainide, moricizine, procainamide, propafenone, and  quinidine -cisapride -cyclobenzaprine -delavirdine -droperidol -haloperidol -hawthorn -imatinib -levomethadyl -maprotiline -medicines for malaria like chloroquine and halofantrine -pentamidine -phenothiazines like chlorpromazine, mesoridazine, prochlorperazine, thioridazine -pimozide -quinine -ranolazine -ritonavir -sertindole -ziprasidone This medicine may also interact with the following medications: -cimetidine -medicines for angina or high blood pressure -medicines to control heart rhythm like amiodarone and digoxin This list may not describe all possible interactions. Give your health care provider a list of all the medicines, herbs, non-prescription drugs, or dietary supplements you use. Also tell them if you smoke, drink alcohol, or use illegal drugs. Some items may interact with your medicine. What should I watch for while using this medicine? Visit your doctor or health care professional for regular checks on your progress. Because your condition and the use of this medicine carries some risk, it is a good idea to carry an identification card, necklace or bracelet with details of your condition, medications and doctor or health care professional. Check your blood pressure and pulse rate regularly. Ask your health care professional what your blood pressure and pulse rate should be, and when you should contact him or her. Your doctor or health care professional also may schedule regular blood tests and electrocardiograms to check your progress. You may get drowsy or dizzy. Do not drive, use machinery, or do anything that needs mental alertness until you know how this medicine affects you. Do not stand or sit up quickly, especially if you are an older patient. This reduces the risk of dizzy or fainting spells. Alcohol can make you more dizzy, increase flushing and rapid heartbeats. Avoid alcoholic drinks. What side effects may I notice from receiving this medicine? Side effects  that you should report to your doctor or health care professional as soon as possible: -chest pain, continued irregular heartbeats -difficulty breathing -swelling of the legs or feet -trembling, shaking -unusually weak or tired Side effects that usually do not require medical attention (report to your doctor or health care professional if they continue or are bothersome): -blurred vision -constipation -headache -nausea, vomiting -stomach pain This list may not describe all possible side effects. Call your doctor for medical advice about side effects. You may report side effects to FDA at 1-800-FDA-1088. Where should I keep my medicine? Keep out of the reach of children. Store at room temperature between 15 and 30 degrees C (59 and 86 degrees F). Protect from light. Keep container tightly closed. Throw away any unused medicine after the expiration date. NOTE: This sheet is a summary. It may not cover all possible information. If you have questions about this medicine, talk to your doctor, pharmacist, or health care provider.  2018 Elsevier/Gold Standard (2007-07-05 16:46:09)

## 2017-01-19 NOTE — Progress Notes (Signed)
Electrophysiology Office Note   Date:  01/19/2017   ID:  Lindsey Murphy, DOB May 27, 1955, MRN [id number]  PCP:  Lindsey Cherry, MD  Cardiologist:  Lindsey Murphy Primary Electrophysiologist:  Will Jorja Loa, MD    Chief Complaint  Patient presents with  . Pacemaker Check    Symptomatic Bradycardia     History of Present Illness: Lindsey Murphy is a 61 y.o. female who is being seen today for the evaluation of PVCs, systolic heart failure at the request of Lindsey Murphy Lindsey Oven, MD. Presenting today for electrophysiology evaluation.  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  She also has a history of diastolic heart failure, congenital coronary artery anomaly, and hyperlipidemia.  She also has a Biotronik pacemaker for symptomatic bradycardia.  She does have a history of PVCs.  PVC burden is at 8% based on her device interrogation.  She has symptoms of palpitations when she is relaxing.  Otherwise she does not note her PVC burden.    Today, she denies symptoms of palpitations, chest pain, shortness of breath, orthopnea, PND, lower extremity edema, claudication, dizziness, presyncope, syncope, bleeding, or neurologic sequela. The patient is tolerating medications without difficulties.    Past Medical History:  Diagnosis Date  . Arthritis   . GERD (gastroesophageal reflux disease)   . Heart murmur   . Hx of Bell's palsy   . Hypertension   . Myocardial infarction (HCC)    12/2012 - vasospasms   . Peripheral vascular disease (HCC)   . Pink eye disease of left eye    diagnosed 12/06/2014   . Pneumonia    hx of pneumonia   . Sleep apnea    cpap- does not know settings   Past Surgical History:  Procedure Laterality Date  . acterior cervical fusion     . left eye duct eye surgery     . right knee surgery        Current Outpatient Medications  Medication Sig Dispense Refill  . amLODipine (NORVASC) 2.5 MG tablet Take 1 tablet (2.5 mg total) by mouth daily. 90 tablet 3  .  atorvastatin (LIPITOR) 10 MG tablet Take 1 tablet (10 mg total) by mouth daily. 90 tablet 3  . azelastine (ASTELIN) 0.1 % nasal spray Place into both nostrils 2 (two) times daily. Use in each nostril as directed    . Calcium Carb-Cholecalciferol (CALCIUM 600+D3) 600-800 MG-UNIT TABS Take 1 tablet by mouth 2 (two) times daily.    . cholecalciferol (VITAMIN D) 1000 UNITS tablet Take 1,000 Units by mouth daily.    . clopidogrel (PLAVIX) 75 MG tablet Take 1 tablet (75 mg total) by mouth daily. 90 tablet 3  . fluticasone (FLONASE) 50 MCG/ACT nasal spray Place 2 sprays into both nostrils daily. Bedtime    . furosemide (LASIX) 20 MG tablet Take 20 mg by mouth 2 (two) times daily.    Marland Kitchen glucosamine-chondroitin (GLUCOSAMINE-CHONDROITIN DS) 500-400 MG tablet Take 1 tablet by mouth 2 (two) times daily.     Marland Kitchen guaiFENesin (MUCINEX) 600 MG 12 hr tablet Take 600 mg by mouth daily.     Marland Kitchen loratadine (CLARITIN) 10 MG tablet Take 10 mg by mouth daily.    . metoprolol succinate (TOPROL-XL) 25 MG 24 hr tablet Take 1 tablet (25 mg total) by mouth 2 (two) times daily. 180 tablet 3  . montelukast (SINGULAIR) 10 MG tablet Take 10 mg by mouth at bedtime.    . Multiple Vitamin (MULTIVITAMIN WITH MINERALS) TABS tablet Take 1 tablet  by mouth daily.    . nitroGLYCERIN (NITROSTAT) 0.4 MG SL tablet Place 0.4 mg every 5 (five) minutes as needed under the tongue for chest pain.     . Omega-3 Fatty Acids (SALMON OIL-1000 PO) Take 1 tablet by mouth 2 (two) times daily.     . pantoprazole (PROTONIX) 40 MG tablet Take 40 mg by mouth 2 (two) times daily.    . potassium chloride (K-DUR) 10 MEQ tablet Take 10 mEq by mouth daily.    . Secukinumab (COSENTYX 300 DOSE [place]) Inject into the skin. Every 4 weeks,am    . vitamin C (ASCORBIC ACID) 500 MG tablet Take 500 mg by mouth daily.    . Wheat Dextrin (BENEFIBER DRINK MIX PO) Take 1 scoop by mouth daily.     No current facility-administered medications for this visit.     Allergies:    Penicillins; Polymyxin b-trimethoprim; Etanercept; Lovastatin er [lovastatin]; Meloxicam; Other; and Tape   Social History:  The patient  reports that  has never smoked. she has never used smokeless tobacco. She reports that she does not drink alcohol or use drugs.   Family History:  The patient's family history includes Heart attack in her maternal grandmother; Heart block in her mother; Stroke in her mother; Valvular heart disease in her mother.    ROS:  Please see the history of present illness.   Otherwise, review of systems is positive for cough.   All other systems are reviewed and negative.    PHYSICAL EXAM: VS:  BP 124/88   Pulse 79   Ht 5\' 4"  (1.626 m)   Wt 243 lb 12.8 oz (110.6 kg)   BMI 41.85 kg/m  , BMI Body mass index is 41.85 kg/m. GEN: Well nourished, well developed, in no acute distress  HEENT: normal  Neck: no JVD, carotid bruits, or masses Cardiac: RRR; no murmurs, rubs, or gallops,no edema  Respiratory:  clear to auscultation bilaterally, normal work of breathing GI: soft, nontender, nondistended, + BS MS: no deformity or atrophy  Skin: warm and dry Neuro:  Strength and sensation are intact Psych: euthymic mood, full affect  EKG:  EKG is ordered today. Personal review of the ekg ordered shows A paced, rate 79  Recent Labs: 09/16/2016: BNP CANCELED 12/28/2016: ALT 35; BUN 19; Creatinine, Ser 0.92; Potassium 3.9; Sodium 142    Lipid Panel     Component Value Date/Time   CHOL 175 12/28/2016 1123   TRIG 148 12/28/2016 1123   HDL 57 12/28/2016 1123   CHOLHDL 3.1 12/28/2016 1123   LDLCALC 88 12/28/2016 1123     Wt Readings from Last 3 Encounters:  01/19/17 243 lb 12.8 oz (110.6 kg)  12/28/16 244 lb 6.4 oz (110.9 kg)  09/16/16 240 lb 12.8 oz (109.2 kg)      Other studies Reviewed: Additional studies/ records that were reviewed today include: TTE  04/05/16 Review of the above records today demonstrates:  LV cavity mild to moderately dilated.  Ejection  fraction 45-50% Mildly dilated left atrium Trace aortic regurgitation Structurally normal mitral valve   ASSESSMENT AND PLAN:  1.  PVCs: 8% on device interrogation.  She does have symptoms of palpitations mainly when she is at rest.  Due to her palpitations, we will plan to start her on flecainide 100 mg twice a day.  We will get an exercise treadmill test to evaluate flecainide safety.  2.  Mild systolic and diastolic heart failure: No signs of volume overload.  3.  Hyperlipidemia:  Continue statin per primary cardiology  4.  Congenital anomaly of coronary artery: Continue Plavix, beta-blocker, and statin  5.  Symptomatic bradycardia: Status post Biotronik pacemaker functioning appropriately.  No changes.    Current medicines are reviewed at length with the patient today.   The patient does not have concerns regarding her medicines.  The following changes were made today:  none  Labs/ tests ordered today include:  Orders Placed This Encounter  Procedures  . EKG 12-Lead     Disposition:   FU with Will Camnitz 3 months  Signed, Will Jorja LoaMartin Camnitz, MD  01/19/2017 12:06 PM     [facility] [address] [place] [address] [phone] (office) [phone] (fax)

## 2017-02-02 ENCOUNTER — Ambulatory Visit (INDEPENDENT_AMBULATORY_CARE_PROVIDER_SITE_OTHER): Payer: 59

## 2017-02-02 DIAGNOSIS — I493 Ventricular premature depolarization: Secondary | ICD-10-CM | POA: Diagnosis not present

## 2017-02-02 DIAGNOSIS — E785 Hyperlipidemia, unspecified: Secondary | ICD-10-CM | POA: Diagnosis not present

## 2017-02-02 DIAGNOSIS — Q245 Malformation of coronary vessels: Secondary | ICD-10-CM

## 2017-02-02 LAB — EXERCISE TOLERANCE TEST
CHL CUP MPHR: 161 {beats}/min
CHL CUP RESTING HR STRESS: 71 {beats}/min
CHL RATE OF PERCEIVED EXERTION: 17
CSEPEDS: 0 s
Estimated workload: 7 METS
Exercise duration (min): 6 min
Peak HR: 130 {beats}/min
Percent HR: 80 %

## 2017-02-16 ENCOUNTER — Telehealth: Payer: Self-pay | Admitting: Cardiology

## 2017-02-16 NOTE — Telephone Encounter (Signed)
In regards to her participation in the CVOT select trial  For 2.4 mg semaglutiude

## 2017-02-17 NOTE — Telephone Encounter (Signed)
Left message regarding trial and okay per Dr. Dulce Sellar to participate.

## 2017-02-17 NOTE — Telephone Encounter (Signed)
Lindsey Murphy returned call stating fax didn't go through, new fax number given, refaxed letter.

## 2017-02-17 NOTE — Telephone Encounter (Signed)
Lindsey Murphy states fax received, no further information needed.

## 2017-02-28 DIAGNOSIS — R7989 Other specified abnormal findings of blood chemistry: Secondary | ICD-10-CM

## 2017-02-28 HISTORY — DX: Other specified abnormal findings of blood chemistry: R79.89

## 2017-03-16 DIAGNOSIS — E041 Nontoxic single thyroid nodule: Secondary | ICD-10-CM

## 2017-03-16 HISTORY — DX: Nontoxic single thyroid nodule: E04.1

## 2017-03-22 ENCOUNTER — Telehealth: Payer: Self-pay | Admitting: Cardiology

## 2017-03-22 NOTE — Telephone Encounter (Signed)
New message ° °Pt verbalized that she is calling for RN °

## 2017-03-22 NOTE — Telephone Encounter (Signed)
Pt reports that she thought her device checks would start coming through our office. States that she received statement that Dr. Terri Skains checked it remotely in December. She would like to know when/if our office will taking over her device checks. Informed patient that I would send to our device clinic to address switching her to our care.  Advised that our device clinic would contact her about this situation. She is appreciative.

## 2017-03-23 NOTE — Telephone Encounter (Signed)
Spoke with Lindsey Murphy informed her that we would transition her home monitoring over to our clinic today. Pt voiced understanding.

## 2017-04-25 ENCOUNTER — Telehealth: Payer: Self-pay | Admitting: Cardiology

## 2017-04-25 NOTE — Telephone Encounter (Signed)
Wants you to call her in reference to some things she is going to need for her job when she comes in for her appt Wednesday

## 2017-04-26 NOTE — Telephone Encounter (Signed)
Patient is having a meeting with her supervisor tomorrow afternoon after her appointment with Dr. Dulce Sellar. Patient needs a list of documentation on our letter head stating the date and nature of illness or injury, how long she has been out of work and if she can return, and if patient can perform regular duty or light duty. Reviewing with supervisor to determine if this needs to be sent to [employer] or if we give documentation in office.

## 2017-04-26 NOTE — Progress Notes (Signed)
Cardiology Office Note:    Date:  04/27/2017   ID:  [name], DOB Nov 16, 1955, MRN [id number]  PCP:  Everlean Cherry, MD  Cardiologist:  Norman Herrlich, MD    Referring MD: Everlean Cherry, MD    ASSESSMENT:    1. Chronic diastolic heart failure (HCC)   2. Hypertensive heart disease with heart failure (HCC)   3. Congenital anomaly of coronary artery   4. Frequent PVCs   5. Chronic diastolic congestive heart failure (HCC)   6. Pacemaker   7. High risk medication use    PLAN:    In order of problems listed above:  1. Stable compensators, she will continue her current diuretic daily weights and sodium restriction. 2. Stable continue current treatment beta-blocker diuretic 3. Stable she is undergone extensive evaluation with advanced cardiac imaging and CT surgery evaluation at [facility] and at this time does not require surgical intervention.  Note she sustained 2 myocardial infarctions without obstructive CAD 4. Improved continue her antiarrhythmic drug no clinical evidence of toxicity including today's EKG performed and reviewed for management of high risk medication 5. See #1 6. Stable managed in my practice 7. Flecainide no evidence of toxicity will check blood level with labs today including renal profile as hypokalemia can induce ventricular tachycardia with flecainide liver function and LDL cholesterol with lipid-lowering therapy.   Next appointment: 3 months   Medication Adjustments/Labs and Tests Ordered: Current medicines are reviewed at length with the patient today.  Concerns regarding medicines are outlined above.  Orders Placed This Encounter  Procedures  . Lipid Profile  . Comprehensive Metabolic Panel (CMET)  . Pro b natriuretic peptide (BNP)  . EKG 12-Lead   No orders of the defined types were placed in this encounter.   Chief Complaint  Patient presents with  . Follow-up    4 month flup appt   . Coronary Artery Disease  . Hypertension  .  Hyperlipidemia    History of Present Illness:    Lindsey Murphy is a 62 y.o. 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 She was referred to [facility] for evaluation of her anomalous coronary vessel seen by cardiology EP cardiothoracic surgery has had a CT and MRI performed and a decision was made that there was no flow limitation and no indication for surgical intervention.She was  last seen 12/28/16.She was seen by EP and started on an AAD Flecanide for PVC burden 42f 8%.  My note 12/28/16: ASSESSMENT:    1. Chronic diastolic congestive heart failure (HCC)   2. Frequent PVCs   3. Pacemaker   4. Congenital anomaly of coronary artery   5. Hyperlipidemia, unspecified hyperlipidemia type    PLAN:    In order of problems listed above: 1. Stable  2. continue her current dose of loop  Diuretic 3. Stable continue her beta blocker and at this time I don't think she requires an antiarrhythmic drug 4. Referral to EP for device care she requests Dr. Johney Frame 5. Stable continue medical treatment clopidogrel beta blocker and high intensity statin 6. Stable continue her statin check liver function for safety and lipid profile for efficacy  Compliance with diet, lifestyle and medications: Yes  Weights are stable at home and intermittently takes an extra dose of diuretic for weight gain of 3  pounds.  She has no edema orthopnea PND.  She finished cardiac rehabilitation but finds herself so fatigued after activities like this that she has to go home and rest and sleep to recover.  [facility]  She has had no angina.  Antiarrhythmic therapy has reduced her palpitation where it is not severe or sustained and improved her sense of well-being.  She has had no syncope or TIA.   She underwent a stress test looking for evidence of proarrhythmia VT and none was present.  Her exercise tolerance was preserved.  Past Medical History:  Diagnosis Date  . Arthritis   . GERD (gastroesophageal reflux disease)   . Heart murmur   . Hx of Bell's palsy   . Hypertension   . Myocardial infarction (HCC)    12/2012 - vasospasms   . Peripheral vascular disease (HCC)   . Pink eye disease of left eye    diagnosed 12/06/2014   . Pneumonia    hx of pneumonia   . Sleep apnea    cpap- does not know settings    Past Surgical History:  Procedure Laterality Date  . acterior cervical fusion     . left eye duct eye surgery     . right knee surgery     . TOTAL HIP ARTHROPLASTY Left 12/17/2014   Procedure: LEFT TOTAL HIP ARTHROPLASTY ANTERIOR APPROACH;  Surgeon: Durene Romans, MD;  Location: [facility]S;  Service: Orthopedics;  Laterality: Left;    Current Medications: Current Meds  Medication Sig  . amLODipine (NORVASC) 2.5 MG tablet Take 1 tablet (2.5 mg total) by mouth daily.  Marland Kitchen atorvastatin (LIPITOR) 10 MG tablet Take 1 tablet (10 mg total) by mouth daily.  Marland Kitchen azelastine (ASTELIN) 0.1 % nasal spray Place into both nostrils 2 (two) times daily. Use in each nostril as directed  . Calcium Carb-Cholecalciferol (CALCIUM 600+D3) 600-800 MG-UNIT TABS Take 1 tablet by mouth 2 (two) times daily.  . cholecalciferol (VITAMIN D) 1000 UNITS tablet Take 1,000 Units by mouth daily.  . clopidogrel (PLAVIX) 75 MG tablet Take 1 tablet (75 mg total) by mouth daily.  . flecainide (TAMBOCOR) 100 MG tablet Take 1 tablet (100 mg total) 2 (two) times daily by mouth.  . fluticasone (FLONASE) 50 MCG/ACT nasal spray Place 2 sprays into both nostrils daily. Bedtime  . furosemide (LASIX) 20 MG tablet Take 20 mg by mouth 2 (two) times daily.  Marland Kitchen glucosamine-chondroitin (GLUCOSAMINE-CHONDROITIN DS) 500-400 MG tablet Take 1 tablet by mouth 2 (two) times daily.   Marland Kitchen guaiFENesin (MUCINEX) 600 MG 12 hr tablet Take 600 mg  by mouth daily.   Marland Kitchen loratadine (CLARITIN) 10 MG tablet Take 10 mg by mouth daily.  . metoprolol succinate (TOPROL-XL) 25 MG 24 hr tablet Take 1 tablet (25 mg total) by mouth 2 (two) times daily.  . montelukast (SINGULAIR) 10 MG tablet Take 10 mg by mouth at bedtime.  . Multiple Vitamin (MULTIVITAMIN WITH MINERALS) TABS tablet Take 1 tablet by mouth daily.  . nitroGLYCERIN (NITROSTAT) 0.4 MG SL tablet Place 0.4 mg every 5 (five) minutes as needed under the tongue for chest pain.   . Omega-3 Fatty Acids (SALMON OIL-1000 PO) Take 1 tablet by mouth 2 (two) times daily.   . pantoprazole (PROTONIX) 40 MG tablet Take 40 mg by mouth daily.   . potassium chloride (K-DUR) 10 MEQ tablet Take 10 mEq by mouth daily.  . Secukinumab (COSENTYX 300 DOSE [place]) Inject into the skin. Every  4 weeks,am  . vitamin C (ASCORBIC ACID) 500 MG tablet Take 500 mg by mouth daily.  . Wheat Dextrin (BENEFIBER DRINK MIX PO) Take 1 scoop by mouth daily.     Allergies:   Penicillins; Polymyxin b-trimethoprim; Etanercept; Lovastatin er [lovastatin]; Meloxicam; Other; and Tape   Social History   Socioeconomic History  . Marital status: Married    Spouse name: None  . Number of children: None  . Years of education: None  . Highest education level: None  Social Needs  . Financial resource strain: None  . Food insecurity - worry: None  . Food insecurity - inability: None  . Transportation needs - medical: None  . Transportation needs - non-medical: None  Occupational History  . None  Tobacco Use  . Smoking status: Never Smoker  . Smokeless tobacco: Never Used  Substance and Sexual Activity  . Alcohol use: No  . Drug use: No  . Sexual activity: None  Other Topics Concern  . None  Social History Narrative  . None     Family History: The patient's family history includes Heart attack in her maternal grandmother; Heart block in her mother; Stroke in her mother; Valvular heart disease in her mother. ROS:   Please  see the history of present illness.    All other systems reviewed and are negative.  EKGs/Labs/Other Studies Reviewed:    The following studies were reviewed today:  EKG:  EKG ordered today.  The ekg ordered today demonstrates [facility] and is stable atrial paced and normal QRS and QTc  Treadmill stress test 02/02/17:  Stress Findings  ECG Baseline ECG exhibits normal sinus rhythm..  Stress Findings The patient exercised following the Bruce protocol.  The patient reported no symptoms during the stress test. The patient experienced no angina during the stress test.   The patient requested the test to be stopped.   Heart rate demonstrated a normal response to exercise. Blood pressure demonstrated a hypertensive response to exercise. Overall, the patient's exercise capacity was normal.   Recovery time: 5 minutes. Note- patient was on Flecainide and Toprol  Response to Stress There was no ST segment deviation noted during stress.  Arrhythmias during stress: none.  Arrhythmias during recovery: none.  There were no significant arrhythmias noted during the test.  ECG was interpretable.  Stress Measurements  Baseline Vitals  Rest HR 71 bpm    Rest BP 131/76 mmHg    Exercise Time  Exercise duration (min) 6 min    Exercise duration (sec) 0 sec    Peak Stress Vitals  Peak HR 130 bpm    Peak BP 204/74 mmHg    Exercise Data  MPHR 161 bpm    Percent HR 80 %    RPE 17     Estimated workload       Recent Labs: 09/16/2016: BNP CANCELED 12/28/2016: ALT 35; BUN 19; Creatinine, Ser 0.92; Potassium 3.9; Sodium 142  Recent Lipid Panel    Component Value Date/Time   CHOL 175 12/28/2016 1123   TRIG 148 12/28/2016 1123   HDL 57 12/28/2016 1123   CHOLHDL 3.1 12/28/2016 1123   LDLCALC 88 12/28/2016 1123    Physical Exam:    VS:  BP 124/82 (BP Location: Right Arm, Patient Position: Sitting, Cuff Size: Normal)   Pulse 68   Ht 5\' 4"  (1.626 m)   Wt 251 lb 12.8 oz (114.2 kg)   SpO2  98%   BMI 43.22 kg/m     Wt Readings  from Last 3 Encounters:  04/27/17 251 lb 12.8 oz (114.2 kg)  01/19/17 243 lb 12.8 oz (110.6 kg)  12/28/16 244 lb 6.4 oz (110.9 kg)     GEN:  Well nourished, well developed in no acute distress HEENT: Normal NECK: No JVD; No carotid bruits LYMPHATICS: No lymphadenopathy CARDIAC: RRR, no murmurs, rubs, gallops RESPIRATORY:  Clear to auscultation without rales, wheezing or rhonchi  ABDOMEN: Soft, non-tender, non-distended MUSCULOSKELETAL:  No edema; No deformity  SKIN: Warm and dry NEUROLOGIC:  Alert and oriented x 3 PSYCHIATRIC:  Normal affect    Signed, Norman Herrlich, MD  04/27/2017 11:42 AM    [facility]

## 2017-04-26 NOTE — Telephone Encounter (Signed)
Per practice manager okay to fill out for patient. Patient is bringing copy of paperwork to appointment tomorrow.

## 2017-04-27 ENCOUNTER — Ambulatory Visit: Payer: 59 | Admitting: Cardiology

## 2017-04-27 ENCOUNTER — Encounter: Payer: Self-pay | Admitting: Cardiology

## 2017-04-27 VITALS — BP 124/82 | HR 68 | Ht 64.0 in | Wt 251.8 lb

## 2017-04-27 DIAGNOSIS — Z79899 Other long term (current) drug therapy: Secondary | ICD-10-CM

## 2017-04-27 DIAGNOSIS — I110 Hypertensive heart disease with heart failure: Secondary | ICD-10-CM | POA: Diagnosis not present

## 2017-04-27 DIAGNOSIS — I5032 Chronic diastolic (congestive) heart failure: Secondary | ICD-10-CM

## 2017-04-27 DIAGNOSIS — I493 Ventricular premature depolarization: Secondary | ICD-10-CM | POA: Diagnosis not present

## 2017-04-27 DIAGNOSIS — Q245 Malformation of coronary vessels: Secondary | ICD-10-CM | POA: Diagnosis not present

## 2017-04-27 DIAGNOSIS — Z950 Presence of cardiac pacemaker: Secondary | ICD-10-CM

## 2017-04-27 HISTORY — DX: Other long term (current) drug therapy: Z79.899

## 2017-04-27 NOTE — Patient Instructions (Signed)
Medication Instructions:  Your physician recommends that you continue on your current medications as directed. Please refer to the Current Medication list given to you today.  Labwork: Your physician recommends that you return for lab work in: today. BNP, CMP, lipid  Testing/Procedures: You had an EKG today.  Follow-Up: Your physician recommends that you schedule a follow-up appointment in: 3 months.  Any Other Special Instructions Will Be Listed Below (If Applicable).     If you need a refill on your cardiac medications before your next appointment, please call your pharmacy.

## 2017-04-28 LAB — COMPREHENSIVE METABOLIC PANEL
A/G RATIO: 1.7 (ref 1.2–2.2)
ALBUMIN: 4.7 g/dL (ref 3.5–5.5)
ALT: 31 IU/L (ref 0–32)
AST: 37 IU/L (ref 0–40)
Alkaline Phosphatase: 101 IU/L (ref 39–117)
BUN / CREAT RATIO: 18 (ref 9–23)
BUN: 18 mg/dL (ref 6–24)
Bilirubin Total: 0.5 mg/dL (ref 0.0–1.2)
CALCIUM: 10.2 mg/dL (ref 8.7–10.2)
CO2: 26 mmol/L (ref 20–29)
Chloride: 102 mmol/L (ref 96–106)
Creatinine, Ser: 1 mg/dL (ref 0.57–1.00)
GFR, EST AFRICAN AMERICAN: 71 mL/min/{1.73_m2} (ref >59)
GFR, EST NON AFRICAN AMERICAN: 62 mL/min/{1.73_m2} (ref >59)
Globulin, Total: 2.8 g/dL (ref 1.5–4.5)
Glucose: 110 mg/dL — ABNORMAL HIGH (ref 65–99)
Potassium: 4.5 mmol/L (ref 3.5–5.2)
Sodium: 143 mmol/L (ref 134–144)
TOTAL PROTEIN: 7.5 g/dL (ref 6.0–8.5)

## 2017-04-28 LAB — LIPID PANEL
CHOL/HDL RATIO: 3.1 ratio (ref 0.0–4.4)
Cholesterol, Total: 161 mg/dL (ref 100–199)
HDL: 52 mg/dL (ref >39)
LDL Calculated: 68 mg/dL (ref 0–99)
TRIGLYCERIDES: 205 mg/dL — AB (ref 0–149)
VLDL Cholesterol Cal: 41 mg/dL — ABNORMAL HIGH (ref 5–40)

## 2017-04-28 LAB — PRO B NATRIURETIC PEPTIDE: NT-Pro BNP: 174 pg/mL (ref 0–287)

## 2017-04-28 NOTE — Progress Notes (Signed)
Electrophysiology Office Note   Date:  04/29/2017   ID:  Lindsey Murphy, DOB 1955/04/07, MRN [id number]  PCP:  Everlean Cherry, MD  Cardiologist:  Dulce Sellar Primary Electrophysiologist:  Tallen Schnorr Jorja Loa, MD    Chief Complaint  Patient presents with  . Pacemaker Check    PVC's/Sinus node dysfunction     History of Present Illness: Lindsey Murphy is a 62 y.o. female who is being seen today for the evaluation of PVCs, systolic heart failure at the request of Everlean Cherry, MD. Presenting today for electrophysiology evaluation.  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  She also has a history of diastolic heart failure, congenital coronary artery anomaly, and hyperlipidemia.  She also has a Biotronik pacemaker for symptomatic bradycardia.  She does have a history of PVCs.  PVC burden is at 8% based on her prior device interrogation.    Today, denies symptoms of palpitations, chest pain, shortness of breath, orthopnea, PND, lower extremity edema, claudication, dizziness, presyncope, syncope, bleeding, or neurologic sequela. The patient is tolerating medications without difficulties.  Since being put on the flecainide, she is felt much improved.  She has noted no further episodes of palpitations.  She feels like her PVCs are appropriately treated.  Past Medical History:  Diagnosis Date  . Arthritis   . GERD (gastroesophageal reflux disease)   . Heart murmur   . Hx of Bell's palsy   . Hypertension   . Myocardial infarction (HCC)    12/2012 - vasospasms   . Peripheral vascular disease (HCC)   . Pink eye disease of left eye    diagnosed 12/06/2014   . Pneumonia    hx of pneumonia   . Sleep apnea    cpap- does not know settings   Past Surgical History:  Procedure Laterality Date  . acterior cervical fusion     . left eye duct eye surgery     . right knee surgery     . TOTAL HIP ARTHROPLASTY Left 12/17/2014   Procedure: LEFT TOTAL HIP ARTHROPLASTY ANTERIOR APPROACH;   Surgeon: Durene Romans, MD;  Location: [facility];  Service: Orthopedics;  Laterality: Left;     Current Outpatient Medications  Medication Sig Dispense Refill  . amLODipine (NORVASC) 2.5 MG tablet Take 1 tablet (2.5 mg total) by mouth daily. 90 tablet 3  . atorvastatin (LIPITOR) 10 MG tablet Take 1 tablet (10 mg total) by mouth daily. 90 tablet 3  . azelastine (ASTELIN) 0.1 % nasal spray Place into both nostrils 2 (two) times daily. Use in each nostril as directed    . Calcium Carb-Cholecalciferol (CALCIUM 600+D3) 600-800 MG-UNIT TABS Take 1 tablet by mouth 2 (two) times daily.    . cholecalciferol (VITAMIN D) 1000 UNITS tablet Take 1,000 Units by mouth daily.    . clopidogrel (PLAVIX) 75 MG tablet Take 1 tablet (75 mg total) by mouth daily. 90 tablet 3  . flecainide (TAMBOCOR) 100 MG tablet Take 1 tablet (100 mg total) 2 (two) times daily by mouth. 60 tablet 3  . fluticasone (FLONASE) 50 MCG/ACT nasal spray Place 2 sprays into both nostrils daily. Bedtime    . furosemide (LASIX) 20 MG tablet Take 20 mg by mouth 2 (two) times daily.    Marland Kitchen glucosamine-chondroitin (GLUCOSAMINE-CHONDROITIN DS) 500-400 MG tablet Take 1 tablet by mouth 2 (two) times daily.     Marland Kitchen guaiFENesin (MUCINEX) 600 MG 12 hr tablet Take 600 mg by mouth daily.     Marland Kitchen loratadine (CLARITIN) 10  MG tablet Take 10 mg by mouth daily.    . metoprolol succinate (TOPROL-XL) 25 MG 24 hr tablet Take 1 tablet (25 mg total) by mouth 2 (two) times daily. 180 tablet 3  . montelukast (SINGULAIR) 10 MG tablet Take 10 mg by mouth at bedtime.    . Multiple Vitamin (MULTIVITAMIN WITH MINERALS) TABS tablet Take 1 tablet by mouth daily.    . nitroGLYCERIN (NITROSTAT) 0.4 MG SL tablet Place 0.4 mg every 5 (five) minutes as needed under the tongue for chest pain.     . Omega-3 Fatty Acids (SALMON OIL-1000 PO) Take 1 tablet by mouth 2 (two) times daily.     . pantoprazole (PROTONIX) 40 MG tablet Take 40 mg by mouth daily.     . potassium chloride (K-DUR) 10  MEQ tablet Take 10 mEq by mouth daily.    . Secukinumab (COSENTYX 300 DOSE [place]) Inject into the skin. Every 4 weeks,am    . vitamin C (ASCORBIC ACID) 500 MG tablet Take 500 mg by mouth daily.    . Wheat Dextrin (BENEFIBER DRINK MIX PO) Take 1 scoop by mouth daily.     No current facility-administered medications for this visit.     Allergies:   Penicillins; Polymyxin b-trimethoprim; Etanercept; Lovastatin er [lovastatin]; Meloxicam; Other; and Tape   Social History:  The patient  reports that  has never smoked. she has never used smokeless tobacco. She reports that she does not drink alcohol or use drugs.   Family History:  The patient's family history includes Heart attack in her maternal grandmother; Heart block in her mother; Stroke in her mother; Valvular heart disease in her mother.    ROS:  Please see the history of present illness.   Otherwise, review of systems is positive for none.   All other systems are reviewed and negative.   PHYSICAL EXAM: VS:  BP 110/64   Pulse 69   Ht 5\' 4"  (1.626 m)   Wt 250 lb (113.4 kg)   BMI 42.91 kg/m  , BMI Body mass index is 42.91 kg/m. GEN: Well nourished, well developed, in no acute distress  HEENT: normal  Neck: no JVD, carotid bruits, or masses Cardiac: RRR; no murmurs, rubs, or gallops,no edema  Respiratory:  clear to auscultation bilaterally, normal work of breathing GI: soft, nontender, nondistended, + BS MS: no deformity or atrophy  Skin: warm and dry, device site well healed Neuro:  Strength and sensation are intact Psych: euthymic mood, full affect  EKG:  EKG is ordered today. Personal review of the ekg ordered shows sinus rhythm, 1dAVB  Personal review of the device interrogation today. Results in [facility]   Recent Labs: 09/16/2016: BNP CANCELED 04/27/2017: ALT 31; BUN 18; Creatinine, Ser 1.00; NT-Pro BNP 174; Potassium 4.5; Sodium 143    Lipid Panel     Component Value Date/Time   CHOL 161 04/27/2017 1150   TRIG 205 (H)  04/27/2017 1150   HDL 52 04/27/2017 1150   CHOLHDL 3.1 04/27/2017 1150   LDLCALC 68 04/27/2017 1150     Wt Readings from Last 3 Encounters:  04/29/17 250 lb (113.4 kg)  04/27/17 251 lb 12.8 oz (114.2 kg)  01/19/17 243 lb 12.8 oz (110.6 kg)      Other studies Reviewed: Additional studies/ records that were reviewed today include: TTE  04/05/16 Review of the above records today demonstrates:  LV cavity mild to moderately dilated.  Ejection fraction 45-50% Mildly dilated left atrium Trace aortic regurgitation Structurally normal mitral  valve  ETT 02/02/17  Blood pressure demonstrated a hypertensive response to exercise.  There was no ST segment deviation noted during stress.   Normal ETT No ischemia HTN response to exercise   ASSESSMENT AND PLAN:  1.  PVCs: Present on previous device interrogation.  She is put on flecainide at her last visit.  She is feeling much improved without noticeable palpitations.  No further changes.  2.  Mild systolic and diastolic heart failure: No obvious signs of volume overload  3.  Hyperlipidemia: Continue statin per primary cardiology  4.  Congenital anomaly of coronary artery: Continue Plavix, beta-blocker, statin  5.  Symptomatic bradycardia: Status post Biotronik pacemaker functioning appropriately.  No changes.  Current medicines are reviewed at length with the patient today.   The patient does not have concerns regarding her medicines.  The following changes were made today:  none  Labs/ tests ordered today include:  Orders Placed This Encounter  Procedures  . EKG 12-Lead     Disposition:   FU with Osbaldo Mark 12 months  Signed, Jodean Valade Jorja Loa, MD  04/29/2017 10:56 AM     [facility] [address] [place] [address] [phone] (office) [phone] (fax)

## 2017-04-29 ENCOUNTER — Encounter: Payer: Self-pay | Admitting: Cardiology

## 2017-04-29 ENCOUNTER — Ambulatory Visit (INDEPENDENT_AMBULATORY_CARE_PROVIDER_SITE_OTHER): Payer: 59 | Admitting: Cardiology

## 2017-04-29 VITALS — BP 110/64 | HR 69 | Ht 64.0 in | Wt 250.0 lb

## 2017-04-29 DIAGNOSIS — I493 Ventricular premature depolarization: Secondary | ICD-10-CM

## 2017-04-29 DIAGNOSIS — I5032 Chronic diastolic (congestive) heart failure: Secondary | ICD-10-CM | POA: Diagnosis not present

## 2017-04-29 DIAGNOSIS — E785 Hyperlipidemia, unspecified: Secondary | ICD-10-CM

## 2017-04-29 DIAGNOSIS — R001 Bradycardia, unspecified: Secondary | ICD-10-CM

## 2017-04-29 DIAGNOSIS — Z950 Presence of cardiac pacemaker: Secondary | ICD-10-CM

## 2017-04-29 LAB — FLECAINIDE LEVEL: Flecainide: 0.46 ug/mL (ref 0.20–1.00)

## 2017-04-29 LAB — CUP PACEART INCLINIC DEVICE CHECK
Date Time Interrogation Session: 20190215154900
Implantable Lead Implant Date: 20171205
Implantable Lead Implant Date: 20171205
Implantable Lead Location: 753859
Implantable Lead Location: 753860
Implantable Lead Model: 377
Implantable Lead Serial Number: 49684101
Implantable Pulse Generator Implant Date: 20171205
Lead Channel Impedance Value: 546 Ohm
Lead Channel Impedance Value: 643 Ohm
Lead Channel Pacing Threshold Amplitude: 0.9 V
Lead Channel Pacing Threshold Amplitude: 1.2 V
Lead Channel Pacing Threshold Pulse Width: 0.4 ms
Lead Channel Pacing Threshold Pulse Width: 0.4 ms
Lead Channel Sensing Intrinsic Amplitude: 1.7 mV
Lead Channel Setting Pacing Pulse Width: 0.4 ms
MDC IDC LEAD SERIAL: 49719338
MDC IDC MSMT LEADCHNL RV SENSING INTR AMPL: 6.7 mV
MDC IDC SET LEADCHNL RA PACING AMPLITUDE: 2 V
MDC IDC SET LEADCHNL RV PACING AMPLITUDE: 2.4 V
Pulse Gen Model: 407145
Pulse Gen Serial Number: 68906976

## 2017-04-29 MED ORDER — FLECAINIDE ACETATE 100 MG PO TABS: 100.0000 mg | ORAL_TABLET | Freq: Two times a day (BID) | ORAL | 3 refills | Status: DC

## 2017-04-29 NOTE — Patient Instructions (Signed)
Medication Instructions:  Your physician recommends that you continue on your current medications as directed. Please refer to the Current Medication list given to you today.  * If you need a refill on your cardiac medications before your next appointment, please call your pharmacy. *  Labwork: None ordered  Testing/Procedures: None ordered  Follow-Up: Remote monitoring is used to monitor your Pacemaker of ICD from home. This monitoring reduces the number of office visits required to check your device to one time per year. It allows [place] to keep an eye on the functioning of your device to ensure it is working properly. You are scheduled for a device check from home on 08/01/2017. You may send your transmission at any time that day. If you have a wireless device, the transmission will be sent automatically. After your physician reviews your transmission, you will receive a postcard with your next transmission date.  Your physician wants you to follow-up in: 1 year with Dr. Elberta Fortis.  You will receive a reminder letter in the mail two months in advance. If you don't receive a letter, please call our office to schedule the follow-up appointment.  Thank you for choosing [facility]!!   Dory Horn, RN [phone]

## 2017-04-29 NOTE — Addendum Note (Signed)
Addended by: Baird Lyons on: 04/29/2017 11:56 AM   Modules accepted: Orders

## 2017-05-02 ENCOUNTER — Telehealth: Payer: Self-pay | Admitting: Cardiology

## 2017-05-02 NOTE — Telephone Encounter (Signed)
Advised patient lab work was normal. No further questions.

## 2017-05-02 NOTE — Telephone Encounter (Signed)
Patient states she got a phone call from you but unsure what it was regarding

## 2017-05-04 DIAGNOSIS — M25562 Pain in left knee: Secondary | ICD-10-CM

## 2017-05-04 DIAGNOSIS — M79671 Pain in right foot: Secondary | ICD-10-CM

## 2017-05-04 HISTORY — DX: Pain in right foot: M79.671

## 2017-05-04 HISTORY — DX: Pain in left knee: M25.562

## 2017-05-19 ENCOUNTER — Telehealth: Payer: Self-pay | Admitting: Cardiology

## 2017-05-19 ENCOUNTER — Other Ambulatory Visit: Payer: Self-pay | Admitting: Cardiology

## 2017-05-19 NOTE — Telephone Encounter (Signed)
About her disability paperwork

## 2017-05-20 NOTE — Telephone Encounter (Signed)
Patient states she has found some other paperwork that may be helpful. Advised patient to contact [employer] and discuss paperwork with them. Patient verbalized understanding, no further questions.

## 2017-05-24 ENCOUNTER — Encounter (HOSPITAL_COMMUNITY): Payer: Self-pay | Admitting: Emergency Medicine

## 2017-05-24 ENCOUNTER — Emergency Department (HOSPITAL_COMMUNITY)
Admission: EM | Admit: 2017-05-24 | Discharge: 2017-05-24 | Disposition: A | Discharge status: Home / Self Care | Payer: 59 | Attending: Emergency Medicine | Admitting: Emergency Medicine

## 2017-05-24 DIAGNOSIS — L03116 Cellulitis of left lower limb: Secondary | ICD-10-CM | POA: Insufficient documentation

## 2017-05-24 DIAGNOSIS — I5032 Chronic diastolic (congestive) heart failure: Secondary | ICD-10-CM | POA: Insufficient documentation

## 2017-05-24 DIAGNOSIS — Z79899 Other long term (current) drug therapy: Secondary | ICD-10-CM | POA: Insufficient documentation

## 2017-05-24 DIAGNOSIS — Z7902 Long term (current) use of antithrombotics/antiplatelets: Secondary | ICD-10-CM | POA: Insufficient documentation

## 2017-05-24 DIAGNOSIS — L03119 Cellulitis of unspecified part of limb: Secondary | ICD-10-CM

## 2017-05-24 DIAGNOSIS — I252 Old myocardial infarction: Secondary | ICD-10-CM | POA: Diagnosis not present

## 2017-05-24 DIAGNOSIS — I110 Hypertensive heart disease with heart failure: Secondary | ICD-10-CM | POA: Insufficient documentation

## 2017-05-24 DIAGNOSIS — R2242 Localized swelling, mass and lump, left lower limb: Secondary | ICD-10-CM | POA: Diagnosis present

## 2017-05-24 DIAGNOSIS — Z96642 Presence of left artificial hip joint: Secondary | ICD-10-CM | POA: Insufficient documentation

## 2017-05-24 HISTORY — DX: Cellulitis of unspecified part of limb: L03.119

## 2017-05-24 LAB — COMPREHENSIVE METABOLIC PANEL
ALBUMIN: 4 g/dL (ref 3.5–5.0)
ALK PHOS: 94 U/L (ref 38–126)
ALT: 32 U/L (ref 14–54)
AST: 38 U/L (ref 15–41)
Anion gap: 9 (ref 5–15)
BUN: 19 mg/dL (ref 6–20)
CO2: 27 mmol/L (ref 22–32)
Calcium: 9.2 mg/dL (ref 8.9–10.3)
Chloride: 103 mmol/L (ref 101–111)
Creatinine, Ser: 0.84 mg/dL (ref 0.44–1.00)
GFR calc Af Amer: >60 mL/min (ref >60)
GFR calc non Af Amer: >60 mL/min (ref >60)
GLUCOSE: 131 mg/dL — AB (ref 65–99)
POTASSIUM: 3.6 mmol/L (ref 3.5–5.1)
SODIUM: 139 mmol/L (ref 135–145)
Total Bilirubin: 0.7 mg/dL (ref 0.3–1.2)
Total Protein: 7.4 g/dL (ref 6.5–8.1)

## 2017-05-24 LAB — CBC WITH DIFFERENTIAL/PLATELET
Basophils Absolute: 0 10*3/uL (ref 0.0–0.1)
Basophils Relative: 0 %
Eosinophils Absolute: 0.3 10*3/uL (ref 0.0–0.7)
Eosinophils Relative: 4 %
HEMATOCRIT: 41.2 % (ref 36.0–46.0)
Hemoglobin: 13.4 g/dL (ref 12.0–15.0)
LYMPHS PCT: 26 %
Lymphs Abs: 1.6 10*3/uL (ref 0.7–4.0)
MCH: 32.8 pg (ref 26.0–34.0)
MCHC: 32.5 g/dL (ref 30.0–36.0)
MCV: 100.7 fL — AB (ref 78.0–100.0)
MONO ABS: 0.5 10*3/uL (ref 0.1–1.0)
MONOS PCT: 9 %
Neutro Abs: 3.8 10*3/uL (ref 1.7–7.7)
Neutrophils Relative %: 61 %
Platelets: 264 10*3/uL (ref 150–400)
RBC: 4.09 MIL/uL (ref 3.87–5.11)
RDW: 13.6 % (ref 11.5–15.5)
WBC: 6.2 10*3/uL (ref 4.0–10.5)

## 2017-05-24 LAB — I-STAT BETA HCG BLOOD, ED (MC, WL, AP ONLY): I-stat hCG, quantitative: <5 m[IU]/mL (ref <5)

## 2017-05-24 LAB — URINALYSIS, ROUTINE W REFLEX MICROSCOPIC
BILIRUBIN URINE: NEGATIVE
GLUCOSE, UA: NEGATIVE mg/dL
HGB URINE DIPSTICK: NEGATIVE
Ketones, ur: NEGATIVE mg/dL
Leukocytes, UA: NEGATIVE
Nitrite: NEGATIVE
PH: 6 (ref 5.0–8.0)
Protein, ur: NEGATIVE mg/dL
Specific Gravity, Urine: 1.021 (ref 1.005–1.030)

## 2017-05-24 LAB — I-STAT CG4 LACTIC ACID, ED: Lactic Acid, Venous: 1.03 mmol/L (ref 0.5–1.9)

## 2017-05-24 MED ORDER — SODIUM CHLORIDE 0.9 % IV SOLN: 1.0000 g | Freq: Once | INTRAVENOUS | Status: DC

## 2017-05-24 MED ORDER — DOXYCYCLINE HYCLATE 100 MG PO CAPS: 100.0000 mg | ORAL_CAPSULE | Freq: Two times a day (BID) | ORAL | 0 refills | Status: DC

## 2017-05-24 MED ORDER — SODIUM CHLORIDE 0.9 % IV SOLN
1.0000 g | Freq: Once | INTRAVENOUS | Status: AC
  Administered 2017-05-24: 1 g via INTRAVENOUS
  Filled 2017-05-24: qty 10

## 2017-05-24 NOTE — ED Triage Notes (Signed)
Patient c/o cellulitis to LLE after fall x3 weeks ago. Reports taking Keflex as prescribed x3 weeks. Seen at [facility] today and sent for further evaluation. Redness and warmth noted to left lower leg. Ambulatory.

## 2017-05-24 NOTE — ED Notes (Signed)
Patient ambulated to bathroom for urine sample. Patient had no problems ambulating or pain.

## 2017-05-24 NOTE — ED Notes (Signed)
Bed: WA06 Expected date:  Expected time:  Means of arrival:  Comments: Hold for [place]

## 2017-05-24 NOTE — Discharge Instructions (Signed)
Take the antibiotics as prescribed, follow-up tomorrow to have the ultrasound of your leg, monitor for fever worsening symptoms.  Follow-up with your primary care doctor or orthopedic doctor later this week to make sure your symptoms are improving

## 2017-05-24 NOTE — ED Provider Notes (Signed)
[facility]-EMERGENCY DEPT Provider Note   CSN: [id number] Arrival date & time: 05/24/17  1454     History   Chief Complaint Chief Complaint  Patient presents with  . Cellulitis    HPI Lindsey Murphy is a 62 y.o. female.  HPI Pt complains of swelling and redness of her left lower extremity.  Patient states her symptoms started all after a fall several weeks ago.  She ended up seeing her orthopedic doctor.  Patient had x-rays and did not have any x-rays.  Patient states she did not injure the skin but ended up developing redness and swelling of her leg.  She was started on Keflex and has been on it for several weeks now.  She went back to see her doctor and the redness has not gotten any better.  Orthopedic doctor was concerned about the possibility of worsening infection and the need for IV antibiotics ( she does have a hip prosthesis on the left side).  She was sent to the ED for further evaluation.  Patient denies any fevers or chills.  No vomiting or diarrhea. Past Medical History:  Diagnosis Date  . Arthritis   . GERD (gastroesophageal reflux disease)   . Heart murmur   . Hx of Bell's palsy   . Hypertension   . Myocardial infarction (HCC)    12/2012 - vasospasms   . Peripheral vascular disease (HCC)   . Pink eye disease of left eye    diagnosed 12/06/2014   . Pneumonia    hx of pneumonia   . Sleep apnea    cpap- does not know settings    Patient Active Problem List   Diagnosis Date Noted  . Hypertensive heart disease with heart failure (HCC) 04/27/2017  . High risk medication use 04/27/2017  . Ischemic cardiomyopathy 09/15/2016  . Severe sinus bradycardia 09/15/2016  . Pacemaker 09/15/2016  . Hyperlipidemia 09/15/2016  . Old MI (myocardial infarction) 09/15/2016  . Chronic diastolic congestive heart failure (HCC) 07/06/2016  . Frequent PVCs 10/05/2015  . S/P left THA, AA 12/17/2014  . Congenital anomaly of coronary artery 11/21/2014     Past Surgical History:  Procedure Laterality Date  . acterior cervical fusion     . left eye duct eye surgery     . right knee surgery     . TOTAL HIP ARTHROPLASTY Left 12/17/2014   Procedure: LEFT TOTAL HIP ARTHROPLASTY ANTERIOR APPROACH;  Surgeon: Durene Romans, MD;  Location: [facility];  Service: Orthopedics;  Laterality: Left;    OB History    No data available       Home Medications    Prior to Admission medications   Medication Sig Start Date End Date Taking? Authorizing Provider  amLODipine (NORVASC) 2.5 MG tablet Take 1 tablet (2.5 mg total) by mouth daily. 10/14/16   Baldo Daub, MD  atorvastatin (LIPITOR) 10 MG tablet Take 1 tablet (10 mg total) by mouth daily. 10/14/16   Baldo Daub, MD  azelastine (ASTELIN) 0.1 % nasal spray Place into both nostrils 2 (two) times daily. Use in each nostril as directed    [provider]  Calcium Carb-Cholecalciferol (CALCIUM 600+D3) 600-800 MG-UNIT TABS Take 1 tablet by mouth 2 (two) times daily.    [provider]  cholecalciferol (VITAMIN D) 1000 UNITS tablet Take 1,000 Units by mouth daily.    [provider]  clopidogrel (PLAVIX) 75 MG tablet Take 1 tablet (75 mg total) by mouth daily. 10/14/16   Munley,  Iline Oven, MD  doxycycline (VIBRAMYCIN) 100 MG capsule Take 1 capsule (100 mg total) by mouth 2 (two) times daily. 05/24/17   Linwood Dibbles, MD  flecainide (TAMBOCOR) 100 MG tablet Take 1 tablet (100 mg total) by mouth 2 (two) times daily. 04/29/17   Camnitz, Will Daphine Deutscher, MD  fluticasone (FLONASE) 50 MCG/ACT nasal spray Place 2 sprays into both nostrils daily. Bedtime    [provider]  furosemide (LASIX) 20 MG tablet TAKE 1 TABLET TWICE A DAY 05/20/17   Munley, Iline Oven, MD  glucosamine-chondroitin (GLUCOSAMINE-CHONDROITIN DS) 500-400 MG tablet Take 1 tablet by mouth 2 (two) times daily.     [provider]  guaiFENesin (MUCINEX) 600 MG 12 hr tablet Take 600 mg by mouth daily.     [provider]  loratadine (CLARITIN) 10 MG tablet Take 10 mg by mouth daily.    [provider]  metoprolol succinate (TOPROL-XL) 25 MG 24 hr tablet Take 1 tablet (25 mg total) by mouth 2 (two) times daily. 10/14/16   Baldo Daub, MD  montelukast (SINGULAIR) 10 MG tablet Take 10 mg by mouth at bedtime.    [provider]  Multiple Vitamin (MULTIVITAMIN WITH MINERALS) TABS tablet Take 1 tablet by mouth daily.    [provider]  nitroGLYCERIN (NITROSTAT) 0.4 MG SL tablet Place 0.4 mg every 5 (five) minutes as needed under the tongue for chest pain.     [provider]  Omega-3 Fatty Acids (SALMON OIL-1000 PO) Take 1 tablet by mouth 2 (two) times daily.     [provider]  pantoprazole (PROTONIX) 40 MG tablet Take 40 mg by mouth daily.     [provider]  potassium chloride (K-DUR) 10 MEQ tablet TAKE 1 TABLET DAILY 05/20/17   Baldo Daub, MD  Secukinumab (COSENTYX 300 DOSE [place]) Inject into the skin. Every 4 weeks,am    [provider]  vitamin C (ASCORBIC ACID) 500 MG tablet Take 500 mg by mouth daily.    [provider]  Wheat Dextrin (BENEFIBER DRINK MIX PO) Take 1 scoop by mouth daily.    [provider]    Family History Family History  Problem Relation Age of Onset  . Stroke Mother   . Valvular heart disease Mother   . Heart block Mother   . Heart attack Maternal Grandmother     Social History Social History   Tobacco Use  . Smoking status: Never Smoker  . Smokeless tobacco: Never Used  Substance Use Topics  . Alcohol use: No  . Drug use: No     Allergies   Penicillins; Polymyxin b-trimethoprim; Etanercept; Lovastatin er [lovastatin]; Meloxicam; Other; and Tape   Review of Systems Review of Systems  All other systems reviewed and are negative.    Physical Exam Updated Vital Signs BP (!) 132/56   Pulse 62   Temp 98.5 F (36.9 C) (Oral)   Resp 20   SpO2 99%   Physical Exam   Constitutional: No distress.  HENT:  Head: Normocephalic and atraumatic.  Right Ear: External ear normal.  Left Ear: External ear normal.  Eyes: Conjunctivae are normal. Right eye exhibits no discharge. Left eye exhibits no discharge. No scleral icterus.  Neck: Neck supple. No tracheal deviation present.  Cardiovascular: Normal rate, regular rhythm and intact distal pulses.  Pulmonary/Chest: Effort normal and breath sounds normal. No stridor. No respiratory distress. She has no wheezes. She has no rales.  Abdominal: Soft. Bowel sounds are  normal. She exhibits no distension. There is no tenderness. There is no rebound and no guarding.  Musculoskeletal: She exhibits edema. She exhibits no tenderness.  Changes consistent with chronic edema bilateral lower extremities, left lower extremity with edema, small area of tenderness on the anterior lateral aspect of her left lower leg, erythema extending up towards the knee, no purulent drainage, no lymphangitic streaking  Neurological: She is alert. She has normal strength. No cranial nerve deficit (no facial droop, extraocular movements intact, no slurred speech) or sensory deficit. She exhibits normal muscle tone. She displays no seizure activity. Coordination normal.  Skin: Skin is warm and dry. No rash noted. She is not diaphoretic.  Psychiatric: She has a normal mood and affect.  Nursing note and vitals reviewed.    ED Treatments / Results  Labs (all labs ordered are listed, but only abnormal results are displayed) Labs Reviewed  COMPREHENSIVE METABOLIC PANEL - Abnormal; Notable for the following components:      Result Value   Glucose, Bld 131 (*)    All other components within normal limits  CBC WITH DIFFERENTIAL/PLATELET - Abnormal; Notable for the following components:   MCV 100.7 (*)    All other components within normal limits  URINALYSIS, ROUTINE W REFLEX MICROSCOPIC  I-STAT CG4 LACTIC ACID, ED  I-STAT BETA HCG BLOOD, ED ([facility], [facility],  [facility] ONLY)     Radiology No results found.  Procedures Procedures (including critical care time)  Medications Ordered in ED Medications  cefTRIAXone (ROCEPHIN) 1 g in sodium chloride 0.9 % 100 mL IVPB (not administered)     Initial Impression / Assessment and Plan / ED Course  I have reviewed the triage vital signs and the nursing notes.  Pertinent labs & imaging results that were available during my care of the patient were reviewed by me and considered in my medical decision making (see chart for details).  Clinical Course as of May 24 1936  Tue May 24, 2017  5956 Patient presents to the emergency room for evaluation of probable cellulitis.  She has been on antibiotics for several weeks.  Patient's laboratory tests are normal.  She does not have a leukocytosis.  Her lactic acid level is normal.  [JK]    Clinical Course User Index [JK] Linwood Dibbles, MD  Patient presents to the emergency room for evaluation of persistent redness of her lower leg.  She was sent for IV antibiotics and possible hospitalization.  Patient does not have any signs of systemic infection.  I think is reasonable to try a different oral antibiotic at this point.  I am also concerned about her persistent erythema.  I will have her follow-up tomorrow to make sure she does not have a DVT.  Vascular ultrasound is not available at this time of night.  Patient understands to monitor for fever worsening symptoms and to return to the ED.  Final Clinical Impressions(s) / ED Diagnoses   Final diagnoses:  Cellulitis of left lower extremity    ED Discharge Orders        Ordered    LE VENOUS     05/24/17 1933    doxycycline (VIBRAMYCIN) 100 MG capsule  2 times daily     05/24/17 1938       Linwood Dibbles, MD 05/24/17 2030

## 2017-05-25 ENCOUNTER — Ambulatory Visit (HOSPITAL_COMMUNITY)
Admission: RE | Admit: 2017-05-25 | Discharge: 2017-05-25 | Disposition: A | Discharge status: Home / Self Care | Payer: 59 | Source: Ambulatory Visit | Attending: Emergency Medicine | Admitting: Emergency Medicine

## 2017-05-25 DIAGNOSIS — M79605 Pain in left leg: Secondary | ICD-10-CM | POA: Diagnosis not present

## 2017-05-25 DIAGNOSIS — M7989 Other specified soft tissue disorders: Secondary | ICD-10-CM | POA: Insufficient documentation

## 2017-05-25 NOTE — Progress Notes (Signed)
Left lower extremity venous duplex has been completed. Negative for DVT.  05/25/17 8:36 AM Olen Cordial RVT

## 2017-05-31 DIAGNOSIS — J4520 Mild intermittent asthma, uncomplicated: Secondary | ICD-10-CM | POA: Insufficient documentation

## 2017-05-31 HISTORY — DX: Mild intermittent asthma, uncomplicated: J45.20

## 2017-06-30 ENCOUNTER — Telehealth: Payer: Self-pay | Admitting: *Deleted

## 2017-06-30 MED ORDER — MEXILETINE HCL 200 MG PO CAPS: 200.0000 mg | ORAL_CAPSULE | Freq: Two times a day (BID) | ORAL | 0 refills | Status: DC

## 2017-06-30 NOTE — Telephone Encounter (Signed)
Advised patient to stop Flecainide, per Dr. Elberta Fortis. Further explained reasoning for stopping d/t her MI hx. Informed that he would like to start another medication called Mexiletine for her PVCs. Pt aware I will send a Rx to pharmacy for her to get price quote to determine if affordable/not. Pt and I agreed to speak tomorrow/next week to determine if pt can afford to start this medication. She is appreciative of the call and agreeable to plan.

## 2017-07-04 NOTE — Telephone Encounter (Signed)
Follow Up   Pt c/o medication issue:  1. Name of Medication: Flecainide  2. How are you currently taking this medication (dosage and times per day)? n/a  3. Are you having a reaction (difficulty breathing--STAT)? no  4. What is your medication issue? Pt states that she is calling to check on the medication change and says that the medication she id being switch too is affordable

## 2017-07-05 ENCOUNTER — Telehealth: Payer: Self-pay | Admitting: Cardiology

## 2017-07-05 NOTE — Telephone Encounter (Signed)
Please call regarding medication that Dr. Elberta Fortis told patient Dr. Dulce Sellar is taking care of.

## 2017-07-05 NOTE — Telephone Encounter (Signed)
Pt reports that she can afford Mexiletine. Pt understands I will discuss dosing w/ Dr. Elberta Fortis tomorrow and call her.

## 2017-07-05 NOTE — Telephone Encounter (Signed)
Patient advised discussing with Dr. Elberta Fortis' nurse and one of [place] will return call to her.

## 2017-07-06 MED ORDER — MEXILETINE HCL 200 MG PO CAPS: 200.0000 mg | ORAL_CAPSULE | Freq: Two times a day (BID) | ORAL | 3 refills | Status: DC

## 2017-07-06 NOTE — Telephone Encounter (Signed)
Advised to start Mexiletine 200 mg BID. Informed that I would discuss f/u w/ Camnitz and let her know. Patient verbalized understanding and agreeable to plan.

## 2017-07-11 ENCOUNTER — Encounter: Payer: Self-pay | Admitting: Cardiology

## 2017-07-11 ENCOUNTER — Telehealth: Payer: Self-pay | Admitting: Cardiology

## 2017-07-11 NOTE — Telephone Encounter (Signed)
Pt very concerned about the side effects of Mexiletine. She would like to talk more w/ Dr. Elberta Fortis about this before making a decision. Pt aware I will discuss with Dr. Elberta Fortis tomorrow and call her. She will try and get MyChart activated also. She is agreeable to plan.

## 2017-07-11 NOTE — Telephone Encounter (Signed)
Pt c/o medication issue:  1. Name of Medication:   mexiletine (MEXITIL) 200 MG capsule    2. How are you currently taking this medication (dosage and times per day)? Take 1 capsule (200 mg total) by mouth 2 (two) times daily.  3. Are you having a reaction (difficulty breathing--STAT)? no 4. What is your medication issue? Pt has some questions because she is afraid to start the medications due to the warnings

## 2017-07-29 ENCOUNTER — Ambulatory Visit: Payer: 59 | Admitting: Cardiology

## 2017-08-01 ENCOUNTER — Ambulatory Visit (INDEPENDENT_AMBULATORY_CARE_PROVIDER_SITE_OTHER): Payer: 59 | Admitting: *Deleted

## 2017-08-01 DIAGNOSIS — Z950 Presence of cardiac pacemaker: Secondary | ICD-10-CM

## 2017-08-01 DIAGNOSIS — I5032 Chronic diastolic (congestive) heart failure: Secondary | ICD-10-CM

## 2017-08-01 NOTE — Progress Notes (Signed)
Remote pacemaker transmission.   

## 2017-08-04 LAB — CUP PACEART REMOTE DEVICE CHECK
Date Time Interrogation Session: 20190523091329
Implantable Lead Implant Date: 20171205
Implantable Lead Implant Date: 20171205
Implantable Lead Location: 753859
Implantable Lead Location: 753860
Implantable Lead Model: 377
Implantable Lead Serial Number: 49719338
Implantable Pulse Generator Implant Date: 20171205
MDC IDC LEAD SERIAL: 49684101
MDC IDC PG SERIAL: 68906976
Pulse Gen Model: 407145

## 2017-08-23 NOTE — Progress Notes (Signed)
Cardiology Office Note:    Date:  08/24/2017   ID:  Lindsey Murphy, DOB 05-28-1955, MRN [id number]  PCP:  Everlean Cherry, MD  Cardiologist:  Norman Herrlich, MD    Referring MD: Everlean Cherry, MD    ASSESSMENT:    1. Chronic diastolic congestive heart failure (HCC)   2. Pacemaker   3. Hyperlipidemia, unspecified hyperlipidemia type   4. Frequent PVCs   5. High risk medication use    PLAN:    In order of problems listed above:  1. Stable she is compensators New [place] Heart Association class I to class II continue her current loop diuretic 2. Stable followed in our device clinic 3. Continue his statin check liver function today along with a lipid profile 4. Improved continue her antiarrhythmic drug and beta-blocker she has home telemetry for her pacemaker 5. Continue her antiarrhythmic drug   Next appointment: 6 months   Medication Adjustments/Labs and Tests Ordered: Current medicines are reviewed at length with the patient today.  Concerns regarding medicines are outlined above.  No orders of the defined types were placed in this encounter.  No orders of the defined types were placed in this encounter.   Chief Complaint  Patient presents with  . Follow-up    History of Present Illness:    Lindsey Murphy is a 62 y.o. 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 She was referred to [facility] for evaluation of her anomalous coronary vessel seen by cardiology EP cardiothoracic surgery has had a CT and MRI performed and a decision was made that there was no flow limitation and no indication for surgical intervention. last seen 04/27/17. ASSESSMENT:    1. Chronic diastolic heart failure (HCC)   2. Hypertensive heart disease with heart failure (HCC)   3. Congenital  anomaly of coronary artery   4. Frequent PVCs   5. Chronic diastolic congestive heart failure (HCC)   6. Pacemaker   7. High risk medication use    PLAN:    1.   Stable compensated, she will continue her current diuretic daily weights and sodium restriction. 6. Stable continue current treatment beta-blocker diuretic 7. Stable she is undergone extensive evaluation with advanced cardiac imaging and CT surgery evaluation at [facility] and at this time does not require surgical intervention.  Note she sustained 2 myocardial infarctions without obstructive CAD 8. Improved continue her antiarrhythmic drug no clinical evidence of toxicity including today's EKG performed and reviewed for management of high risk medication 9. See #1 10. Stable managed in my practice 11. Flecainide no evidence of toxicity will check blood level with labs today including renal profile as hypokalemia can induce ventricular tachycardia with flecainide liver function and LDL cholesterol with lipid-lowering therapy.   Compliance with diet, lifestyle and medications: Yes  Overall she is pleased with the quality of her life is not having palpitation syncope chest pain shortness of breath or edema. Past Medical History:  Diagnosis Date  . Arthritis   . GERD (gastroesophageal reflux disease)   . Heart murmur   . Hx of Bell's palsy   . Hypertension   . Myocardial infarction (HCC)    12/2012 - vasospasms   . Peripheral vascular disease (HCC)   . Pink eye disease of left eye    diagnosed  12/06/2014   . Pneumonia    hx of pneumonia   . Sleep apnea    cpap- does not know settings    Past Surgical History:  Procedure Laterality Date  . acterior cervical fusion     . left eye duct eye surgery     . right knee surgery     . TOTAL HIP ARTHROPLASTY Left 12/17/2014   Procedure: LEFT TOTAL HIP ARTHROPLASTY ANTERIOR APPROACH;  Surgeon: Durene Romans, MD;  Location: [facility];  Service: Orthopedics;  Laterality: Left;     Current Medications: Current Meds  Medication Sig  . amLODipine (NORVASC) 2.5 MG tablet Take 1 tablet (2.5 mg total) by mouth daily.  Marland Kitchen atorvastatin (LIPITOR) 10 MG tablet Take 1 tablet (10 mg total) by mouth daily.  . Azelastine HCl 0.15 % SOLN Place 1 spray into the nose 2 (two) times daily.   . Calcium Carb-Cholecalciferol (CALCIUM 600+D3) 600-800 MG-UNIT TABS Take 1 tablet by mouth 2 (two) times daily.  . Cholecalciferol (VITAMIN D PO) Take 5,000 Units by mouth daily.   . clopidogrel (PLAVIX) 75 MG tablet Take 1 tablet (75 mg total) by mouth daily.  . fluticasone (FLONASE) 50 MCG/ACT nasal spray Place 2 sprays into both nostrils daily. Bedtime  . furosemide (LASIX) 20 MG tablet TAKE 1 TABLET TWICE A DAY  . glucosamine-chondroitin (GLUCOSAMINE-CHONDROITIN DS) 500-400 MG tablet Take 1 tablet by mouth 2 (two) times daily.   Marland Kitchen guaiFENesin (MUCINEX) 600 MG 12 hr tablet Take 600 mg by mouth at bedtime.   Marland Kitchen loratadine (CLARITIN) 10 MG tablet Take 10 mg by mouth daily.  . metoprolol succinate (TOPROL-XL) 25 MG 24 hr tablet Take 1 tablet (25 mg total) by mouth 2 (two) times daily.  Marland Kitchen mexiletine (MEXITIL) 200 MG capsule Take 1 capsule (200 mg total) by mouth 2 (two) times daily.  . montelukast (SINGULAIR) 10 MG tablet Take 10 mg by mouth at bedtime.  . Multiple Vitamin (MULTIVITAMIN WITH MINERALS) TABS tablet Take 1 tablet by mouth daily.  . nitroGLYCERIN (NITROSTAT) 0.4 MG SL tablet Place 0.4 mg every 5 (five) minutes as needed under the tongue for chest pain.   . Omega-3 Fatty Acids (SALMON OIL-1000 PO) Take 1 tablet by mouth 2 (two) times daily.   . pantoprazole (PROTONIX) 40 MG tablet Take 40 mg by mouth 2 (two) times daily.   . potassium chloride (K-DUR) 10 MEQ tablet TAKE 1 TABLET DAILY  . vitamin C (ASCORBIC ACID) 500 MG tablet Take 500 mg by mouth daily.     Allergies:   Penicillins; Polymyxin b-trimethoprim; Etanercept; Lovastatin er [lovastatin]; Meloxicam; Other; and Tape    Social History   Socioeconomic History  . Marital status: Married    Spouse name: Not on file  . Number of children: Not on file  . Years of education: Not on file  . Highest education level: Not on file  Occupational History  . Not on file  Social Needs  . Financial resource strain: Not on file  . Food insecurity:    Worry: Not on file    Inability: Not on file  . Transportation needs:    Medical: Not on file    Non-medical: Not on file  Tobacco Use  . Smoking status: Never Smoker  . Smokeless tobacco: Never Used  Substance and Sexual Activity  . Alcohol use: No  . Drug use: No  . Sexual activity: Not on file  Lifestyle  . Physical activity:    Days per week:  Not on file    Minutes per session: Not on file  . Stress: Not on file  Relationships  . Social connections:    Talks on phone: Not on file    Gets together: Not on file    Attends religious service: Not on file    Active member of club or organization: Not on file    Attends meetings of clubs or organizations: Not on file    Relationship status: Not on file  Other Topics Concern  . Not on file  Social History Narrative  . Not on file     Family History: The patient's family history includes Heart attack in her maternal grandmother; Heart block in her mother; Stroke in her mother; Valvular heart disease in her mother. ROS:   Please see the history of present illness.    All other systems reviewed and are negative.  EKGs/Labs/Other Studies Reviewed:    The following studies were reviewed today:    Recent Labs:   07/05/17 CBC normal 09/16/2016: BNP CANCELED 04/27/2017: NT-Pro BNP 174 05/24/2017: ALT 32; BUN 19; Creatinine, Ser 0.84; Hemoglobin 13.4; Platelets 264; Potassium 3.6; Sodium 139  Recent Lipid Panel    Component Value Date/Time   CHOL 161 04/27/2017 1150   TRIG 205 (H) 04/27/2017 1150   HDL 52 04/27/2017 1150   CHOLHDL 3.1 04/27/2017 1150   LDLCALC 68 04/27/2017 1150    Physical  Exam:    VS:  There were no vitals taken for this visit.    Wt Readings from Last 3 Encounters:  04/29/17 250 lb (113.4 kg)  04/27/17 251 lb 12.8 oz (114.2 kg)  01/19/17 243 lb 12.8 oz (110.6 kg)     GEN:  Well nourished, well developed in no acute distress HEENT: Normal NECK: No JVD; No carotid bruits LYMPHATICS: No lymphadenopathy CARDIAC: RRR, no murmurs, rubs, gallops RESPIRATORY:  Clear to auscultation without rales, wheezing or rhonchi  ABDOMEN: Soft, non-tender, non-distended MUSCULOSKELETAL:  No edema; No deformity  SKIN: Warm and dry NEUROLOGIC:  Alert and oriented x 3 PSYCHIATRIC:  Normal affect    Signed, Norman Herrlich, MD  08/24/2017 9:43 AM    [facility]

## 2017-08-24 ENCOUNTER — Ambulatory Visit: Payer: BLUE CROSS/BLUE SHIELD | Admitting: Cardiology

## 2017-08-24 ENCOUNTER — Encounter: Payer: Self-pay | Admitting: Cardiology

## 2017-08-24 VITALS — BP 126/78 | HR 81 | Ht 64.0 in | Wt 254.8 lb

## 2017-08-24 DIAGNOSIS — Z79899 Other long term (current) drug therapy: Secondary | ICD-10-CM | POA: Diagnosis not present

## 2017-08-24 DIAGNOSIS — Z950 Presence of cardiac pacemaker: Secondary | ICD-10-CM

## 2017-08-24 DIAGNOSIS — I5032 Chronic diastolic (congestive) heart failure: Secondary | ICD-10-CM | POA: Diagnosis not present

## 2017-08-24 DIAGNOSIS — E785 Hyperlipidemia, unspecified: Secondary | ICD-10-CM | POA: Diagnosis not present

## 2017-08-24 DIAGNOSIS — I493 Ventricular premature depolarization: Secondary | ICD-10-CM | POA: Diagnosis not present

## 2017-08-24 NOTE — Patient Instructions (Signed)
Medication Instructions:  Your physician recommends that you continue on your current medications as directed. Please refer to the Current Medication list given to you today.  Labwork: Your physician recommends that you have the following labs drawn: CMP, lipid  Testing/Procedures: None  Follow-Up: Your physician wants you to follow-up in: 6 months. You will receive a reminder letter in the mail two months in advance. If you don't receive a letter, please call our office to schedule the follow-up appointment.  Any Other Special Instructions Will Be Listed Below (If Applicable).     If you need a refill on your cardiac medications before your next appointment, please call your pharmacy.   

## 2017-08-25 LAB — COMPREHENSIVE METABOLIC PANEL
ALK PHOS: 103 IU/L (ref 39–117)
ALT: 33 IU/L — AB (ref 0–32)
AST: 41 IU/L — AB (ref 0–40)
Albumin/Globulin Ratio: 1.6 (ref 1.2–2.2)
Albumin: 4.7 g/dL (ref 3.6–4.8)
BUN / CREAT RATIO: 18 (ref 12–28)
BUN: 18 mg/dL (ref 8–27)
Bilirubin Total: 0.4 mg/dL (ref 0.0–1.2)
CHLORIDE: 102 mmol/L (ref 96–106)
CO2: 26 mmol/L (ref 20–29)
Calcium: 9.7 mg/dL (ref 8.7–10.3)
Creatinine, Ser: 1.02 mg/dL — ABNORMAL HIGH (ref 0.57–1.00)
GFR calc Af Amer: 69 mL/min/{1.73_m2} (ref >59)
GFR calc non Af Amer: 60 mL/min/{1.73_m2} (ref >59)
GLUCOSE: 78 mg/dL (ref 65–99)
Globulin, Total: 2.9 g/dL (ref 1.5–4.5)
Potassium: 4.4 mmol/L (ref 3.5–5.2)
Sodium: 144 mmol/L (ref 134–144)
Total Protein: 7.6 g/dL (ref 6.0–8.5)

## 2017-08-25 LAB — LIPID PANEL
CHOLESTEROL TOTAL: 169 mg/dL (ref 100–199)
Chol/HDL Ratio: 3.1 ratio (ref 0.0–4.4)
HDL: 55 mg/dL (ref >39)
LDL Calculated: 87 mg/dL (ref 0–99)
TRIGLYCERIDES: 135 mg/dL (ref 0–149)
VLDL CHOLESTEROL CAL: 27 mg/dL (ref 5–40)

## 2017-09-06 ENCOUNTER — Telehealth: Payer: Self-pay

## 2017-09-06 DIAGNOSIS — I493 Ventricular premature depolarization: Secondary | ICD-10-CM

## 2017-09-06 MED ORDER — METOPROLOL SUCCINATE ER 25 MG PO TB24: 25.0000 mg | ORAL_TABLET | Freq: Two times a day (BID) | ORAL | 3 refills | Status: DC

## 2017-09-06 MED ORDER — AMLODIPINE BESYLATE 2.5 MG PO TABS: 2.5000 mg | ORAL_TABLET | Freq: Every day | ORAL | 3 refills | Status: DC

## 2017-09-06 NOTE — Telephone Encounter (Signed)
Rx for amlodipine 2.5mg  and metoprolol succ 25mg  sent to pharmacy as requested.

## 2017-10-19 ENCOUNTER — Encounter: Payer: Self-pay | Admitting: Cardiology

## 2017-10-19 ENCOUNTER — Ambulatory Visit (INDEPENDENT_AMBULATORY_CARE_PROVIDER_SITE_OTHER): Payer: BLUE CROSS/BLUE SHIELD | Admitting: Cardiology

## 2017-10-19 VITALS — BP 104/68 | HR 91 | Ht 64.0 in | Wt 255.8 lb

## 2017-10-19 DIAGNOSIS — E785 Hyperlipidemia, unspecified: Secondary | ICD-10-CM

## 2017-10-19 DIAGNOSIS — I2510 Atherosclerotic heart disease of native coronary artery without angina pectoris: Secondary | ICD-10-CM

## 2017-10-19 DIAGNOSIS — I493 Ventricular premature depolarization: Secondary | ICD-10-CM | POA: Diagnosis not present

## 2017-10-19 DIAGNOSIS — Z950 Presence of cardiac pacemaker: Secondary | ICD-10-CM

## 2017-10-19 DIAGNOSIS — I5032 Chronic diastolic (congestive) heart failure: Secondary | ICD-10-CM | POA: Diagnosis not present

## 2017-10-19 DIAGNOSIS — R001 Bradycardia, unspecified: Secondary | ICD-10-CM | POA: Diagnosis not present

## 2017-10-19 NOTE — Patient Instructions (Signed)
Medication Instructions:  Your physician recommends that you continue on your current medications as directed. Please refer to the Current Medication list given to you today.  * If you need a refill on your cardiac medications before your next appointment, please call your pharmacy.   Labwork: None ordered *We will only notify you of abnormal results, otherwise continue current treatment plan.  Testing/Procedures: None ordered  Follow-Up: Your physician wants you to follow-up in: 6 months with Dr. Camnitz.  You will receive a reminder letter in the mail two months in advance. If you don't receive a letter, please call our office to schedule the follow-up appointment.  *Please note that any paperwork needing to be filled out by the provider will need to be addressed at the front desk prior to seeing the provider. Please note that any FMLA, disability or other documents regarding health condition is subject to a $25.00 charge that must be received prior to completion of paperwork in the form of a money order or check.  Thank you for choosing [facility]!!   Lawyer Washabaugh, RN [phone]  Any Other Special Instructions Will Be Listed Below (If Applicable).        

## 2017-10-19 NOTE — Progress Notes (Signed)
Electrophysiology Office Note   Date:  10/19/2017   ID:  Lindsey Murphy, DOB Feb 12, 1956, MRN [id number]  PCP:  Everlean Cherry, MD  Cardiologist:  Dulce Sellar Primary Electrophysiologist:  Adeli Frost Jorja Loa, MD    No chief complaint on file.    History of Present Illness: Lindsey Murphy is a 62 y.o. female who is being seen today for the evaluation of PVCs, systolic heart failure at the request of Everlean Cherry, MD. Presenting today for electrophysiology evaluation.  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  She also has a history of diastolic heart failure, congenital coronary artery anomaly, and hyperlipidemia.  She also has a Biotronik pacemaker for symptomatic bradycardia.  She does have a history of PVCs.  PVC burden is at 8% based on her prior device interrogation.   Today, denies symptoms of palpitations, chest pain, shortness of breath, orthopnea, PND, lower extremity edema, claudication, dizziness, presyncope, syncope, bleeding, or neurologic sequela. The patient is tolerating medications without difficulties.  Overall she is feeling well.  She has not noted any increased palpitations.  Her main complaint today is of her sciatica pain.  Past Medical History:  Diagnosis Date  . Arthritis   . GERD (gastroesophageal reflux disease)   . Heart murmur   . Hx of Bell's palsy   . Hypertension   . Myocardial infarction (HCC)    12/2012 - vasospasms   . Peripheral vascular disease (HCC)   . Pink eye disease of left eye    diagnosed 12/06/2014   . Pneumonia    hx of pneumonia   . Sleep apnea    cpap- does not know settings   Past Surgical History:  Procedure Laterality Date  . acterior cervical fusion     . left eye duct eye surgery     . right knee surgery     . TOTAL HIP ARTHROPLASTY Left 12/17/2014   Procedure: LEFT TOTAL HIP ARTHROPLASTY ANTERIOR APPROACH;  Surgeon: Durene Romans, MD;  Location: [facility];  Service: Orthopedics;  Laterality: Left;     Current  Outpatient Medications  Medication Sig Dispense Refill  . amLODipine (NORVASC) 2.5 MG tablet Take 1 tablet (2.5 mg total) by mouth daily. 90 tablet 3  . atorvastatin (LIPITOR) 10 MG tablet Take 1 tablet (10 mg total) by mouth daily. 90 tablet 3  . Azelastine HCl 0.15 % SOLN Place 1 spray into the nose 2 (two) times daily.     . Calcium Carb-Cholecalciferol (CALCIUM 600+D3) 600-800 MG-UNIT TABS Take 1 tablet by mouth 2 (two) times daily.    . Cholecalciferol (VITAMIN D PO) Take 5,000 Units by mouth daily.     . clopidogrel (PLAVIX) 75 MG tablet Take 1 tablet (75 mg total) by mouth daily. 90 tablet 3  . COSENTYX SENSOREADY 300 DOSE 150 MG/ML SOAJ Inject 300 mg into the skin every 28 (twenty-eight) days.     . fluticasone (FLONASE) 50 MCG/ACT nasal spray Place 2 sprays into both nostrils daily. Bedtime    . furosemide (LASIX) 20 MG tablet TAKE 1 TABLET TWICE A DAY 180 tablet 3  . glucosamine-chondroitin (GLUCOSAMINE-CHONDROITIN DS) 500-400 MG tablet Take 1 tablet by mouth 2 (two) times daily.     Marland Kitchen guaiFENesin (MUCINEX) 600 MG 12 hr tablet Take 600 mg by mouth at bedtime.     Marland Kitchen loratadine (CLARITIN) 10 MG tablet Take 10 mg by mouth daily.    . metoprolol succinate (TOPROL-XL) 25 MG 24 hr tablet Take 1 tablet (25 mg  total) by mouth 2 (two) times daily. 180 tablet 3  . mexiletine (MEXITIL) 200 MG capsule Take 1 capsule (200 mg total) by mouth 2 (two) times daily. 60 capsule 3  . montelukast (SINGULAIR) 10 MG tablet Take 10 mg by mouth at bedtime.    . Multiple Vitamin (MULTIVITAMIN WITH MINERALS) TABS tablet Take 1 tablet by mouth daily.    . nitroGLYCERIN (NITROSTAT) 0.4 MG SL tablet Place 0.4 mg every 5 (five) minutes as needed under the tongue for chest pain.     . Omega-3 Fatty Acids (SALMON OIL-1000 PO) Take 1 tablet by mouth 2 (two) times daily.     . pantoprazole (PROTONIX) 40 MG tablet Take 40 mg by mouth 2 (two) times daily.     . potassium chloride (K-DUR) 10 MEQ tablet TAKE 1 TABLET DAILY  90 tablet 3  . vitamin C (ASCORBIC ACID) 500 MG tablet Take 500 mg by mouth daily.     No current facility-administered medications for this visit.     Allergies:   Penicillins; Polymyxin b-trimethoprim; Tape; Etanercept; Lovastatin er [lovastatin]; Meloxicam; and Other   Social History:  The patient  reports that she has never smoked. She has never used smokeless tobacco. She reports that she does not drink alcohol or use drugs.   Family History:  The patient's family history includes Heart attack in her maternal grandmother; Heart block in her mother; Stroke in her mother; Valvular heart disease in her mother.   ROS:  Please see the history of present illness.   Otherwise, review of systems is positive for back pain.   All other systems are reviewed and negative.   PHYSICAL EXAM: VS:  There were no vitals taken for this visit. , BMI There is no height or weight on file to calculate BMI. GEN: Well nourished, well developed, in no acute distress  HEENT: normal  Neck: no JVD, carotid bruits, or masses Cardiac: RRR; no murmurs, rubs, or gallops,no edema  Respiratory:  clear to auscultation bilaterally, normal work of breathing GI: soft, nontender, nondistended, + BS MS: no deformity or atrophy  Skin: warm and dry, device site well healed Neuro:  Strength and sensation are intact Psych: euthymic mood, full affect  EKG:  EKG is ordered today. Personal review of the ekg ordered shows sinus rhythm, rate 91, frequent PVCs  Personal review of the device interrogation today. Results in [facility]   Recent Labs: 04/27/2017: NT-Pro BNP 174 05/24/2017: Hemoglobin 13.4; Platelets 264 08/24/2017: ALT 33; BUN 18; Creatinine, Ser 1.02; Potassium 4.4; Sodium 144    Lipid Panel     Component Value Date/Time   CHOL 169 08/24/2017 1002   TRIG 135 08/24/2017 1002   HDL 55 08/24/2017 1002   CHOLHDL 3.1 08/24/2017 1002   LDLCALC 87 08/24/2017 1002     Wt Readings from Last 3 Encounters:    08/24/17 254 lb 12.8 oz (115.6 kg)  04/29/17 250 lb (113.4 kg)  04/27/17 251 lb 12.8 oz (114.2 kg)      Other studies Reviewed: Additional studies/ records that were reviewed today include: TTE  04/05/16 Review of the above records today demonstrates:  LV cavity mild to moderately dilated.  Ejection fraction 45-50% Mildly dilated left atrium Trace aortic regurgitation Structurally normal mitral valve  ETT 02/02/17  Blood pressure demonstrated a hypertensive response to exercise.  There was no ST segment deviation noted during stress.   Normal ETT No ischemia HTN response to exercise   ASSESSMENT AND PLAN:  1.  PVCs: Currently on mexiletine.  Is having multifocal PVCs, with the dominant being a single morphology.  PVCs appear to be coming from the inferior septal left ventricle.  Patient shows 4%.  No changes at this time.  2.  Mild systolic and diastolic heart failure: No obvious signs of volume overload.  No changes.  3.  Hyperlipidemia: Continue statin per primary cardiology  4.  Congenital anomaly of coronary artery: Continue Plavix, beta-blocker, statin  5.  Symptomatic bradycardia: Status post Biotronik dual-chamber pacemaker.  Functioning appropriately.  No changes at this time.  Current medicines are reviewed at length with the patient today.   The patient does not have concerns regarding her medicines.  The following changes were made today: None  Labs/ tests ordered today include:  No orders of the defined types were placed in this encounter.    Disposition:   FU with Daivon Rayos 6 months  Signed, Booker Bhatnagar Jorja Loa, MD  10/19/2017 9:35 AM     [facility] [address] [place] [address] [phone] (office) [phone] (fax)

## 2017-10-22 ENCOUNTER — Other Ambulatory Visit: Payer: Self-pay | Admitting: Cardiology

## 2017-10-31 ENCOUNTER — Ambulatory Visit (INDEPENDENT_AMBULATORY_CARE_PROVIDER_SITE_OTHER): Payer: BLUE CROSS/BLUE SHIELD | Admitting: *Deleted

## 2017-10-31 DIAGNOSIS — I255 Ischemic cardiomyopathy: Secondary | ICD-10-CM

## 2017-10-31 DIAGNOSIS — I5032 Chronic diastolic (congestive) heart failure: Secondary | ICD-10-CM | POA: Diagnosis not present

## 2017-10-31 NOTE — Progress Notes (Signed)
Remote pacemaker transmission.   

## 2017-11-09 ENCOUNTER — Telehealth: Payer: Self-pay | Admitting: Cardiology

## 2017-11-09 MED ORDER — MEXILETINE HCL 200 MG PO CAPS: 200.0000 mg | ORAL_CAPSULE | Freq: Two times a day (BID) | ORAL | 3 refills | Status: DC

## 2017-11-09 NOTE — Addendum Note (Signed)
Addended by: Demetrios Loll on: 11/09/2017 10:34 AM   Modules accepted: Orders

## 2017-11-09 NOTE — Telephone Encounter (Signed)
Pt's medication was resent to pt's requested pharmacy. Confirmation received.  °

## 2017-11-09 NOTE — Telephone Encounter (Signed)
New Message         *STAT* If patient is at the pharmacy, call can be transferred to refill team.   1. Which medications need to be refilled? (please list name of each medication and dose if known) mexiletine (MEXITIL) 200 MG capsule  2. Which pharmacy/location (including street and city if local pharmacy) is medication to be sent to? [facility]  3. Do they need a 30 day or 90 day supply? 90   Patient wants to get it cheaper

## 2017-11-21 ENCOUNTER — Other Ambulatory Visit: Payer: Self-pay | Admitting: Cardiology

## 2017-11-21 MED ORDER — ATORVASTATIN CALCIUM 10 MG PO TABS: 10.0000 mg | ORAL_TABLET | Freq: Every day | ORAL | 3 refills | Status: AC

## 2017-11-21 MED ORDER — CLOPIDOGREL BISULFATE 75 MG PO TABS: 75.0000 mg | ORAL_TABLET | Freq: Every day | ORAL | 3 refills | Status: DC

## 2017-11-21 NOTE — Telephone Encounter (Signed)
°*  STAT* If patient is at the pharmacy, call can be transferred to refill team.   1. Which medications need to be refilled? (please list name of each medication and dose if known) CLopidigrel 75mg  take I daily   2. Which pharmacy/location (including street and city if local pharmacy) is medication to be sent to? [facility]  3. Do they need a 30 day or 90 day supply? 90   *STAT* If patient is at the pharmacy, call can be transferred to refill team.   1. Which medications need to be refilled? (please list name of each medication and dose if known) Atorvastatin 10mg  takes 1 daily   2. Which pharmacy/location (including street and city if local pharmacy) is medication to be sent to [facility]  3. Do they need a 30 day or 90 day supply? 90

## 2017-11-21 NOTE — Telephone Encounter (Signed)
Rx sent to pharmacy   

## 2017-12-09 LAB — CUP PACEART REMOTE DEVICE CHECK
Battery Remaining Percentage: 85 %
Brady Statistic AP VP Percent: 1 %
Brady Statistic AP VS Percent: 49 %
Brady Statistic RA Percent Paced: 53 %
Brady Statistic RV Percent Paced: 1 %
Implantable Lead Implant Date: 20171205
Implantable Lead Model: 377
Implantable Lead Model: 377
Implantable Lead Serial Number: 49684101
Implantable Pulse Generator Implant Date: 20171205
Lead Channel Impedance Value: 534 Ohm
Lead Channel Pacing Threshold Pulse Width: 0.4 ms
Lead Channel Pacing Threshold Pulse Width: 0.4 ms
Lead Channel Setting Pacing Amplitude: 2 V
Lead Channel Setting Pacing Amplitude: 2.4 V
MDC IDC LEAD IMPLANT DT: 20171205
MDC IDC LEAD LOCATION: 753859
MDC IDC LEAD LOCATION: 753860
MDC IDC LEAD SERIAL: 49719338
MDC IDC MSMT LEADCHNL RA PACING THRESHOLD AMPLITUDE: 0.8 V
MDC IDC MSMT LEADCHNL RV IMPEDANCE VALUE: 585 Ohm
MDC IDC MSMT LEADCHNL RV PACING THRESHOLD AMPLITUDE: 1 V
MDC IDC PG SERIAL: 68906976
MDC IDC SESS DTM: 20190927052236
MDC IDC SET LEADCHNL RV PACING PULSEWIDTH: 0.4 ms
MDC IDC STAT BRADY AS VP PERCENT: 0 %
MDC IDC STAT BRADY AS VS PERCENT: 42 %

## 2018-01-30 ENCOUNTER — Ambulatory Visit (INDEPENDENT_AMBULATORY_CARE_PROVIDER_SITE_OTHER): Payer: BLUE CROSS/BLUE SHIELD

## 2018-01-30 DIAGNOSIS — R001 Bradycardia, unspecified: Secondary | ICD-10-CM | POA: Diagnosis not present

## 2018-01-30 NOTE — Progress Notes (Signed)
Remote pacemaker transmission.   

## 2018-02-02 ENCOUNTER — Encounter: Payer: Self-pay | Admitting: Cardiology

## 2018-02-15 ENCOUNTER — Telehealth: Payer: Self-pay | Admitting: Cardiology

## 2018-02-15 NOTE — Telephone Encounter (Signed)
°*  STAT* If patient is at the pharmacy, call can be transferred to refill team.   1. Which medications need to be refilled? (please list name of each medication and dose if known) Potassium Chloride  10 meq  2. Which pharmacy/location (including street and city if local pharmacy) is medication to be sent to? [facility]  3. Do they need a 30 day or 90 day supply? 90

## 2018-02-16 MED ORDER — POTASSIUM CHLORIDE CRYS ER 10 MEQ PO TBCR: 10.0000 meq | EXTENDED_RELEASE_TABLET | Freq: Every day | ORAL | 0 refills | Status: DC

## 2018-02-16 NOTE — Telephone Encounter (Signed)
Refill for potassium sent to [facility]. Patient is scheduled for follow up on Thursday, 03/02/18 at 10:40 am with Dr. Dulce Sellar.

## 2018-02-16 NOTE — Addendum Note (Signed)
Addended by: Crist Fat on: 02/16/2018 08:33 AM   Modules accepted: Orders

## 2018-02-20 ENCOUNTER — Telehealth: Payer: Self-pay | Admitting: *Deleted

## 2018-02-20 NOTE — Telephone Encounter (Signed)
Pt phoned to say pharmacy is out of Potassium Chloride but has Klor Con so needs [place] to send in the Klor Con instead. Sent to mail in pharmacy.

## 2018-02-21 MED ORDER — KLOR-CON 10 10 MEQ PO TBCR: 10.0000 meq | EXTENDED_RELEASE_TABLET | Freq: Every day | ORAL | 0 refills | Status: DC

## 2018-02-21 NOTE — Telephone Encounter (Signed)
Resent prescription for Klor-Con tablets versus potassium chloride since that is not available through her pharmacy per Dr. Dulce Sellar. Left message informing patient. Advised her to contact our office with any further questions.

## 2018-02-23 ENCOUNTER — Ambulatory Visit: Payer: BLUE CROSS/BLUE SHIELD | Admitting: Cardiology

## 2018-03-01 NOTE — Progress Notes (Signed)
Cardiology Office Note:    Date:  03/02/2018   ID:  [name], DOB 1956-01-28, MRN [id number]  PCP:  Everlean Cherry, MD  Cardiologist:  Norman Herrlich, MD    Referring MD: Everlean Cherry, MD    ASSESSMENT:    1. Hypertensive heart disease with heart failure (HCC)   2. Ischemic cardiomyopathy   3. Congenital anomaly of coronary artery   4. Frequent PVCs   5. Severe sinus bradycardia   6. Pacemaker   7. Hyperlipidemia, unspecified hyperlipidemia type   8. High risk medication use    PLAN:    In order of problems listed above:  1. Stable compensated she will continue her current diuretic sodium restriction home self-management currently New York Heart Association class I 2. Stable ejection fraction range of 45% we will consider a EF assessment at her next visit 3. Stable benign variant does not require surgical reimplantation of the anomalous vessel 4. Improved continue her antiarrhythmic drug check EKG for QT interval 5. Improved with pacemaker asymptomatic 6. Stable function followed in management device clinic I reviewed her last download available from August with the patient 7. Stable she will continue her current statin in the absence of obstructive CAD I am pleased with her LDL level 8. Stable continue antiarrhythmic drug no evidence of toxicity   Next appointment: 6 months   Medication Adjustments/Labs and Tests Ordered: Current medicines are reviewed at length with the patient today.  Concerns regarding medicines are outlined above.  No orders of the defined types were placed in this encounter.  No orders of the defined types were placed in this encounter.   Chief Complaint  Patient presents with  . Follow-up  . Cardiomyopathy  . Congestive Heart Failure    History of Present Illness:    Lindsey Murphy is a 62 y.o. 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 She was referred to [facility] for evaluation of her anomalous coronary vessel seen by cardiology EP cardiothoracic surgery has had a CT and MRI performed and a decision was made that there was no flow limitation and no indication for surgical intervention. last seen 08/24/17.  Compliance with diet, lifestyle and medications: Yes  She really has done well she is pleased with the quality of her life walks daily with her dog exercising and has had no edema shortness of breath orthopnea chest pain palpitation or syncope.  She was placed on mexiletine for ventricular arrhythmia tolerates it well and symptoms have resolved she has had good pacemaker checks with home telemetry.  In retrospect people are describing case presentations like hers is myocarditis I looked at her records MRI from [facility] and unfortunately she was not injected with gadolinium which was given the signature image of myocarditis with myocardial edema and fibrosis.  At this time I would not repeat cardiac imaging. Past Medical History:  Diagnosis Date  . Arthritis   . GERD (gastroesophageal reflux disease)   . Heart murmur   . Hx of Bell's palsy   . Hypertension   . Myocardial infarction (HCC)    12/2012 - vasospasms   . Peripheral vascular disease (HCC)   . Pink eye disease of left eye    diagnosed 12/06/2014   . Pneumonia    hx of  pneumonia   . Sleep apnea    cpap- does not know settings    Past Surgical History:  Procedure Laterality Date  . acterior cervical fusion     . left eye duct eye surgery     . right knee surgery     . TOTAL HIP ARTHROPLASTY Left 12/17/2014   Procedure: LEFT TOTAL HIP ARTHROPLASTY ANTERIOR APPROACH;  Surgeon: Durene Romans, MD;  Location: [facility];  Service: Orthopedics;  Laterality: Left;    Current Medications: Current Meds  Medication Sig  . amLODipine  (NORVASC) 2.5 MG tablet Take 1 tablet (2.5 mg total) by mouth daily.  Marland Kitchen atorvastatin (LIPITOR) 10 MG tablet Take 1 tablet (10 mg total) by mouth daily.  . Azelastine HCl 0.15 % SOLN Place 1 spray into the nose 2 (two) times daily.   . Calcium Carb-Cholecalciferol (CALCIUM 600+D3) 600-800 MG-UNIT TABS Take 1 tablet by mouth 2 (two) times daily.  . Cholecalciferol (VITAMIN D PO) Take 5,000 Units by mouth daily.   . clopidogrel (PLAVIX) 75 MG tablet Take 1 tablet (75 mg total) by mouth daily.  . COSENTYX SENSOREADY 300 DOSE 150 MG/ML SOAJ Inject 300 mg into the skin every 28 (twenty-eight) days.   . fluticasone (FLONASE) 50 MCG/ACT nasal spray Place 2 sprays into both nostrils daily. Bedtime  . furosemide (LASIX) 20 MG tablet Take 20 mg by mouth 2 (two) times daily.   Marland Kitchen glucosamine-chondroitin (GLUCOSAMINE-CHONDROITIN DS) 500-400 MG tablet Take 1 tablet by mouth 2 (two) times daily.   Marland Kitchen guaiFENesin (MUCINEX) 600 MG 12 hr tablet Take 600 mg by mouth at bedtime.   . halobetasol (ULTRAVATE) 0.05 % ointment Apply 1 application topically 2 (two) times daily as needed.  Marland Kitchen KLOR-CON 10 10 MEQ tablet Take 1 tablet (10 mEq total) by mouth daily.  Marland Kitchen loratadine (CLARITIN) 10 MG tablet Take 10 mg by mouth daily.  . metoprolol succinate (TOPROL-XL) 25 MG 24 hr tablet Take 1 tablet (25 mg total) by mouth 2 (two) times daily.  Marland Kitchen mexiletine (MEXITIL) 200 MG capsule TAKE ONE CAPSULE BY MOUTH TWICE DAILY  . montelukast (SINGULAIR) 10 MG tablet Take 10 mg by mouth at bedtime.  . Multiple Vitamin (MULTIVITAMIN WITH MINERALS) TABS tablet Take 1 tablet by mouth daily.  . nitroGLYCERIN (NITROSTAT) 0.4 MG SL tablet Place 0.4 mg every 5 (five) minutes as needed under the tongue for chest pain.   . Omega-3 Fatty Acids (SALMON OIL-1000 PO) Take 1 tablet by mouth 2 (two) times daily.   . pantoprazole (PROTONIX) 40 MG tablet Take 40 mg by mouth 2 (two) times daily.   . potassium chloride (K-DUR,KLOR-CON) 10 MEQ tablet Take 1  tablet (10 mEq total) by mouth daily.  . vitamin C (ASCORBIC ACID) 500 MG tablet Take 500 mg by mouth daily.  . Wheat Dextrin (BENEFIBER PO) Take 1 Scoop by mouth daily.     Allergies:   Penicillins; Polymyxin b-trimethoprim; Tape; Etanercept; Lovastatin er [lovastatin]; and Meloxicam   Social History   Socioeconomic History  . Marital status: Married    Spouse name: Not on file  . Number of children: Not on file  . Years of education: Not on file  . Highest education level: Not on file  Occupational History  . Not on file  Social Needs  . Financial resource strain: Not on file  . Food insecurity:    Worry: Not on file    Inability: Not on file  . Transportation needs:    Medical: Not  on file    Non-medical: Not on file  Tobacco Use  . Smoking status: Never Smoker  . Smokeless tobacco: Never Used  Substance and Sexual Activity  . Alcohol use: No  . Drug use: No  . Sexual activity: Not on file  Lifestyle  . Physical activity:    Days per week: Not on file    Minutes per session: Not on file  . Stress: Not on file  Relationships  . Social connections:    Talks on phone: Not on file    Gets together: Not on file    Attends religious service: Not on file    Active member of club or organization: Not on file    Attends meetings of clubs or organizations: Not on file    Relationship status: Not on file  Other Topics Concern  . Not on file  Social History Narrative  . Not on file     Family History: The patient's family history includes Heart attack in her maternal grandmother; Heart block in her mother; Stroke in her mother; Valvular heart disease in her mother. ROS:   Please see the history of present illness.    All other systems reviewed and are negative.  EKGs/Labs/Other Studies Reviewed:    The following studies were reviewed today  Recent Labs:   02/12/2018 labs included a normal CMP cholesterol 165 HDL 54 LDL 91 CBC normal 04/27/2017: NT-Pro BNP  174 05/24/2017: Hemoglobin 13.4; Platelets 264 08/24/2017: ALT 33; BUN 18; Creatinine, Ser 1.02; Potassium 4.4; Sodium 144  Recent Lipid Panel    Component Value Date/Time   CHOL 169 08/24/2017 1002   TRIG 135 08/24/2017 1002   HDL 55 08/24/2017 1002   CHOLHDL 3.1 08/24/2017 1002   LDLCALC 87 08/24/2017 1002    Physical Exam:    VS:  BP 100/70 (BP Location: Left Arm, Patient Position: Sitting, Cuff Size: Normal)   Pulse 69   Ht 5\' 4"  (1.626 m)   Wt 243 lb (110.2 kg)   SpO2 96%   BMI 41.71 kg/m     Wt Readings from Last 3 Encounters:  03/02/18 243 lb (110.2 kg)  10/19/17 255 lb 12.8 oz (116 kg)  08/24/17 254 lb 12.8 oz (115.6 kg)     GEN:  Well nourished, well developed in no acute distress HEENT: Normal NECK: No JVD; No carotid bruits LYMPHATICS: No lymphadenopathy CARDIAC: RRR, no murmurs, rubs, gallops RESPIRATORY:  Clear to auscultation without rales, wheezing or rhonchi  ABDOMEN: Soft, non-tender, non-distended MUSCULOSKELETAL:  No edema; No deformity  SKIN: Warm and dry NEUROLOGIC:  Alert and oriented x 3 PSYCHIATRIC:  Normal affect    Signed, Norman Herrlich, MD  03/02/2018 11:10 AM    [facility]

## 2018-03-02 ENCOUNTER — Encounter: Payer: Self-pay | Admitting: Cardiology

## 2018-03-02 ENCOUNTER — Ambulatory Visit: Payer: BLUE CROSS/BLUE SHIELD | Admitting: Cardiology

## 2018-03-02 VITALS — BP 100/70 | HR 69 | Ht 64.0 in | Wt 243.0 lb

## 2018-03-02 DIAGNOSIS — Z79899 Other long term (current) drug therapy: Secondary | ICD-10-CM

## 2018-03-02 DIAGNOSIS — R001 Bradycardia, unspecified: Secondary | ICD-10-CM

## 2018-03-02 DIAGNOSIS — E785 Hyperlipidemia, unspecified: Secondary | ICD-10-CM

## 2018-03-02 DIAGNOSIS — I493 Ventricular premature depolarization: Secondary | ICD-10-CM

## 2018-03-02 DIAGNOSIS — I110 Hypertensive heart disease with heart failure: Secondary | ICD-10-CM

## 2018-03-02 DIAGNOSIS — Z950 Presence of cardiac pacemaker: Secondary | ICD-10-CM

## 2018-03-02 DIAGNOSIS — I255 Ischemic cardiomyopathy: Secondary | ICD-10-CM

## 2018-03-02 DIAGNOSIS — Q245 Malformation of coronary vessels: Secondary | ICD-10-CM

## 2018-03-02 NOTE — Patient Instructions (Signed)
Medication Instructions:  Your physician recommends that you continue on your current medications as directed. Please refer to the Current Medication list given to you today.  If you need a refill on your cardiac medications before your next appointment, please call your pharmacy.   Lab work: NONE If you have labs (blood work) drawn today and your tests are completely normal, you will receive your results only by: . MyChart Message (if you have MyChart) OR . A paper copy in the mail If you have any lab test that is abnormal or we need to change your treatment, we will call you to review the results.  Testing/Procedures: You had an EKG today  Follow-Up: At [facility], you and your health needs are our priority.  As part of our continuing mission to provide you with exceptional heart care, we have created designated Provider Care Teams.  These Care Teams include your primary Cardiologist (physician) and Advanced Practice Providers (APPs -  Physician Assistants and Nurse Practitioners) who all work together to provide you with the care you need, when you need it. You will need a follow up appointment in 6 months.  Please call our office 2 months in advance to schedule this appointment.    

## 2018-03-28 LAB — CUP PACEART REMOTE DEVICE CHECK
Date Time Interrogation Session: 20200114195624
Implantable Lead Implant Date: 20171205
Implantable Lead Location: 753859
Implantable Lead Model: 377
Implantable Lead Model: 377
Implantable Lead Serial Number: 49684101
Implantable Lead Serial Number: 49719338
MDC IDC LEAD IMPLANT DT: 20171205
MDC IDC LEAD LOCATION: 753860
MDC IDC PG IMPLANT DT: 20171205
MDC IDC PG SERIAL: 68906976
Pulse Gen Model: 407145

## 2018-03-30 ENCOUNTER — Telehealth: Payer: Self-pay | Admitting: *Deleted

## 2018-03-30 MED ORDER — METOPROLOL SUCCINATE ER 50 MG PO TB24: 50.0000 mg | ORAL_TABLET | Freq: Two times a day (BID) | ORAL | 1 refills | Status: AC

## 2018-03-30 NOTE — Telephone Encounter (Signed)
-----   Message from Will Jorja Loa, MD sent at 03/29/2018  9:15 AM EST ----- Abnormal device interrogation reviewed.  Lead parameters and battery status stable.  NSVT noted. Increase TOprol XL to 50 mg

## 2018-03-30 NOTE — Telephone Encounter (Signed)
Informed patient of results and verbal understanding expressed. Pt supplemental insurance doesn't start until 2/3.  Pt aware I will not send Rx to pharmacy.  Will update medication to reflect new dosage and pt will call when she is ready for [place] to send in Rx.  She will double what she has at home to make 50 mg BID in the meantime.

## 2018-04-24 ENCOUNTER — Ambulatory Visit (INDEPENDENT_AMBULATORY_CARE_PROVIDER_SITE_OTHER): Payer: POS | Admitting: Cardiology

## 2018-04-24 ENCOUNTER — Encounter: Payer: Self-pay | Admitting: Cardiology

## 2018-04-24 VITALS — BP 126/72 | HR 77 | Ht 64.0 in | Wt 241.0 lb

## 2018-04-24 DIAGNOSIS — I493 Ventricular premature depolarization: Secondary | ICD-10-CM | POA: Diagnosis not present

## 2018-04-24 NOTE — Progress Notes (Signed)
Electrophysiology Office Note   Date:  04/24/2018   ID:  Lindsey Murphy, DOB July 02, 1955, MRN [id number]  PCP:  Everlean Cherry, MD  Cardiologist:  Dulce Sellar Primary Electrophysiologist:  [name], MD    No chief complaint on file.    History of Present Illness: Lindsey Murphy is a 63 y.o. female who is being seen today for the evaluation of PVCs, systolic heart failure at the request of Everlean Cherry, MD. Presenting today for electrophysiology evaluation.  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  She also has a history of diastolic heart failure, congenital coronary artery anomaly, and hyperlipidemia.  She also has a Biotronik pacemaker for symptomatic bradycardia.  She does have a history of PVCs.  PVC burden is at 8% based on her prior device interrogation.     Today, denies symptoms of palpitations, chest pain, shortness of breath, orthopnea, PND, lower extremity edema, claudication, dizziness, presyncope, syncope, bleeding, or neurologic sequela. The patient is tolerating medications without difficulties.  Overall she is doing well.  No chest pain or shortness of breath.  She is able to do all her daily activities without restriction.  Past Medical History:  Diagnosis Date  . Arthritis   . GERD (gastroesophageal reflux disease)   . Heart murmur   . Hx of Bell's palsy   . Hypertension   . Myocardial infarction (HCC)    12/2012 - vasospasms   . Peripheral vascular disease (HCC)   . Pink eye disease of left eye    diagnosed 12/06/2014   . Pneumonia    hx of pneumonia   . Sleep apnea    cpap- does not know settings   Past Surgical History:  Procedure Laterality Date  . acterior cervical fusion     . left eye duct eye surgery     . right knee surgery     . TOTAL HIP ARTHROPLASTY Left 12/17/2014   Procedure: LEFT TOTAL HIP ARTHROPLASTY ANTERIOR APPROACH;  Surgeon: Durene Romans, MD;  Location: [facility];  Service: Orthopedics;  Laterality: Left;     Current  Outpatient Medications  Medication Sig Dispense Refill  . amLODipine (NORVASC) 2.5 MG tablet Take 1 tablet (2.5 mg total) by mouth daily. 90 tablet 3  . atorvastatin (LIPITOR) 10 MG tablet Take 1 tablet (10 mg total) by mouth daily. 90 tablet 3  . Azelastine HCl 0.15 % SOLN Place 1 spray into the nose 2 (two) times daily.     . Calcium Carb-Cholecalciferol (CALCIUM 600+D3) 600-800 MG-UNIT TABS Take 1 tablet by mouth 2 (two) times daily.    . Cholecalciferol (VITAMIN D PO) Take 5,000 Units by mouth daily.     . clopidogrel (PLAVIX) 75 MG tablet Take 1 tablet (75 mg total) by mouth daily. 90 tablet 3  . COSENTYX SENSOREADY 300 DOSE 150 MG/ML SOAJ Inject 300 mg into the skin every 28 (twenty-eight) days.     . fluticasone (FLONASE) 50 MCG/ACT nasal spray Place 2 sprays into both nostrils daily. Bedtime    . furosemide (LASIX) 20 MG tablet Take 20 mg by mouth 2 (two) times daily.     Marland Kitchen glucosamine-chondroitin (GLUCOSAMINE-CHONDROITIN DS) 500-400 MG tablet Take 1 tablet by mouth 2 (two) times daily.     Marland Kitchen guaiFENesin (MUCINEX) 600 MG 12 hr tablet Take 600 mg by mouth at bedtime.     . halobetasol (ULTRAVATE) 0.05 % ointment Apply 1 application topically 2 (two) times daily as needed.    Marland Kitchen KLOR-CON 10  10 MEQ tablet Take 1 tablet (10 mEq total) by mouth daily. 90 tablet 0  . loratadine (CLARITIN) 10 MG tablet Take 10 mg by mouth daily.    . metoprolol succinate (TOPROL-XL) 50 MG 24 hr tablet Take 1 tablet (50 mg total) by mouth 2 (two) times daily. Take with or immediately following a meal. 180 tablet 1  . mexiletine (MEXITIL) 200 MG capsule TAKE ONE CAPSULE BY MOUTH TWICE DAILY 180 capsule 3  . montelukast (SINGULAIR) 10 MG tablet Take 10 mg by mouth at bedtime.    . Multiple Vitamin (MULTIVITAMIN WITH MINERALS) TABS tablet Take 1 tablet by mouth daily.    . nitroGLYCERIN (NITROSTAT) 0.4 MG SL tablet Place 0.4 mg every 5 (five) minutes as needed under the tongue for chest pain.     . Omega-3 Fatty  Acids (SALMON OIL-1000 PO) Take 1 tablet by mouth 2 (two) times daily.     . pantoprazole (PROTONIX) 40 MG tablet Take 40 mg by mouth 2 (two) times daily.     . potassium chloride (K-DUR,KLOR-CON) 10 MEQ tablet Take 1 tablet (10 mEq total) by mouth daily. 90 tablet 0  . vitamin C (ASCORBIC ACID) 500 MG tablet Take 500 mg by mouth daily.    . Wheat Dextrin (BENEFIBER PO) Take 1 Scoop by mouth daily.     No current facility-administered medications for this visit.     Allergies:   Penicillins; Polymyxin b-trimethoprim; Tape; Etanercept; Lovastatin er [lovastatin]; and Meloxicam   Social History:  The patient  reports that she has never smoked. She has never used smokeless tobacco. She reports that she does not drink alcohol or use drugs.   Family History:  The patient's family history includes Heart attack in her maternal grandmother; Heart block in her mother; Stroke in her mother; Valvular heart disease in her mother.   ROS:  Please see the history of present illness.   Otherwise, review of systems is positive for none.   All other systems are reviewed and negative.   PHYSICAL EXAM: VS:  There were no vitals taken for this visit. , BMI There is no height or weight on file to calculate BMI. GEN: Well nourished, well developed, in no acute distress  HEENT: normal  Neck: no JVD, carotid bruits, or masses Cardiac: RRR; no murmurs, rubs, or gallops,no edema  Respiratory:  clear to auscultation bilaterally, normal work of breathing GI: soft, nontender, nondistended, + BS MS: no deformity or atrophy  Skin: warm and dry, device site well healed Neuro:  Strength and sensation are intact Psych: euthymic mood, full affect  EKG:  EKG is ordered today. Personal review of the ekg ordered  shows A paced, PVC  Personal review of the device interrogation today. Results in [facility]   Recent Labs: 04/27/2017: NT-Pro BNP 174 05/24/2017: Hemoglobin 13.4; Platelets 264 08/24/2017: ALT 33; BUN 18;  Creatinine, Ser 1.02; Potassium 4.4; Sodium 144    Lipid Panel     Component Value Date/Time   CHOL 169 08/24/2017 1002   TRIG 135 08/24/2017 1002   HDL 55 08/24/2017 1002   CHOLHDL 3.1 08/24/2017 1002   LDLCALC 87 08/24/2017 1002     Wt Readings from Last 3 Encounters:  03/02/18 243 lb (110.2 kg)  10/19/17 255 lb 12.8 oz (116 kg)  08/24/17 254 lb 12.8 oz (115.6 kg)      Other studies Reviewed: Additional studies/ records that were reviewed today include: TTE  04/05/16 Review of the above records today demonstrates:  LV cavity mild to moderately dilated.  Ejection fraction 45-50% Mildly dilated left atrium Trace aortic regurgitation Structurally normal mitral valve  ETT 02/02/17  Blood pressure demonstrated a hypertensive response to exercise.  There was no ST segment deviation noted during stress.   Normal ETT No ischemia HTN response to exercise   ASSESSMENT AND PLAN:  1.  PVCs: Currently on mexiletine.  Does have multifocal PVCs with a dominant morphology.  No changes.  2.  Mild systolic and diastolic heart failure: No signs of volume overload  3.  Hyperlipidemia: Statin per primary cardiology  4.  Congenital anomaly of coronary artery: Continue Plavix, beta-blocker statin, statin  5.  Symptomatic bradycardia: Status post Biotronik dual-chamber pacemaker functioning appropriately.  No changes.    Current medicines are reviewed at length with the patient today.   The patient does not have concerns regarding her medicines.  The following changes were made today: None  Labs/ tests ordered today include:  No orders of the defined types were placed in this encounter.    Disposition:   FU with Khamille Beynon 6 months  Signed, Adolpho Meenach Jorja Loa, MD  04/24/2018 9:25 AM     [facility] [address] [place] [address] [phone] (office) [phone] (fax)

## 2018-04-24 NOTE — Patient Instructions (Signed)
Medication Instructions:  Your physician recommends that you continue on your current medications as directed. Please refer to the Current Medication list given to you today.  *If you need a refill on your cardiac medications before your next appointment, please call your pharmacy*  Labwork: None ordered  Testing/Procedures: None ordered  Follow-Up: Remote monitoring is used to monitor your Pacemaker or ICD from home. This monitoring reduces the number of office visits required to check your device to one time per year. It allows [place] to keep an eye on the functioning of your device to ensure it is working properly. You are scheduled for a device check from home on 05/01/18. You may send your transmission at any time that day. If you have a wireless device, the transmission will be sent automatically. After your physician reviews your transmission, you will receive a postcard with your next transmission date.  Your physician wants you to follow-up in: 6 months with Dr. Elberta Fortis.  You will receive a reminder letter in the mail two months in advance. If you don't receive a letter, please call our office to schedule the follow-up appointment.  Thank you for choosing [facility]!!   Dory Horn, RN [phone]

## 2018-04-30 LAB — CUP PACEART INCLINIC DEVICE CHECK
Brady Statistic RA Percent Paced: 53 %
Brady Statistic RV Percent Paced: 2 %
Implantable Lead Implant Date: 20171205
Implantable Lead Implant Date: 20171205
Implantable Lead Location: 753859
Implantable Lead Model: 377
Implantable Lead Model: 377
Implantable Lead Serial Number: 49684101
Implantable Lead Serial Number: 49719338
Implantable Pulse Generator Implant Date: 20171205
Lead Channel Impedance Value: 585 Ohm
Lead Channel Impedance Value: 663 Ohm
Lead Channel Pacing Threshold Amplitude: 0.8 V
Lead Channel Pacing Threshold Amplitude: 0.8 V
Lead Channel Pacing Threshold Amplitude: 0.8 V
Lead Channel Pacing Threshold Amplitude: 0.9 V
Lead Channel Pacing Threshold Amplitude: 1 V
Lead Channel Pacing Threshold Amplitude: 1 V
Lead Channel Pacing Threshold Pulse Width: 0.4 ms
Lead Channel Pacing Threshold Pulse Width: 0.4 ms
Lead Channel Pacing Threshold Pulse Width: 0.4 ms
Lead Channel Pacing Threshold Pulse Width: 0.4 ms
Lead Channel Pacing Threshold Pulse Width: 0.4 ms
Lead Channel Sensing Intrinsic Amplitude: 1.8 mV
Lead Channel Sensing Intrinsic Amplitude: 3.7 mV
Lead Channel Sensing Intrinsic Amplitude: 8.3 mV
Lead Channel Sensing Intrinsic Amplitude: 8.9 mV
Lead Channel Setting Pacing Amplitude: 2 V
Lead Channel Setting Pacing Amplitude: 2.4 V
Lead Channel Setting Pacing Pulse Width: 0.4 ms
MDC IDC LEAD LOCATION: 753860
MDC IDC MSMT LEADCHNL RV PACING THRESHOLD PULSEWIDTH: 0.4 ms
MDC IDC SESS DTM: 20200210183800
Pulse Gen Serial Number: 68906976

## 2018-05-01 ENCOUNTER — Ambulatory Visit (INDEPENDENT_AMBULATORY_CARE_PROVIDER_SITE_OTHER): Payer: POS

## 2018-05-01 DIAGNOSIS — I495 Sick sinus syndrome: Secondary | ICD-10-CM

## 2018-05-01 DIAGNOSIS — I255 Ischemic cardiomyopathy: Secondary | ICD-10-CM

## 2018-05-01 LAB — CUP PACEART REMOTE DEVICE CHECK
Date Time Interrogation Session: 20200217141154
Implantable Lead Implant Date: 20171205
Implantable Lead Location: 753860
Implantable Lead Model: 377
Implantable Lead Model: 377
Implantable Lead Serial Number: 49684101
Implantable Lead Serial Number: 49719338
Implantable Pulse Generator Implant Date: 20171205
MDC IDC LEAD IMPLANT DT: 20171205
MDC IDC LEAD LOCATION: 753859
Pulse Gen Model: 407145
Pulse Gen Serial Number: 68906976

## 2018-05-08 ENCOUNTER — Encounter: Payer: BLUE CROSS/BLUE SHIELD | Admitting: Cardiology

## 2018-05-09 NOTE — Progress Notes (Signed)
Remote pacemaker transmission.   

## 2018-05-10 ENCOUNTER — Other Ambulatory Visit: Payer: Self-pay | Admitting: *Deleted

## 2018-05-10 MED ORDER — MEXILETINE HCL 200 MG PO CAPS: 200.0000 mg | ORAL_CAPSULE | Freq: Two times a day (BID) | ORAL | 3 refills | Status: DC

## 2018-07-07 ENCOUNTER — Telehealth: Payer: Self-pay | Admitting: Cardiology

## 2018-07-07 NOTE — Telephone Encounter (Signed)
Shanda Bumps called from long term disability and has some questions about patient appt in December, 2019. Her number is [phone]

## 2018-07-10 NOTE — Telephone Encounter (Signed)
Shanda Bumps from long term disability is calling to see if patient should continue to remain out of work.  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  She referred to last office note in Dec 2019, but there was no mention of this in the note.  Please advise.

## 2018-07-10 NOTE — Telephone Encounter (Signed)
Shanda Bumps notified that at this time Lindsey Murphy is not able to return to work per Dr Dulce Sellar.  Shanda Bumps made aware that patient is due to follow up with Dr Dulce Sellar in June 2020.  No further questions at this time.

## 2018-07-10 NOTE — Telephone Encounter (Signed)
RTW-No

## 2018-07-31 ENCOUNTER — Ambulatory Visit (INDEPENDENT_AMBULATORY_CARE_PROVIDER_SITE_OTHER): Payer: POS | Admitting: *Deleted

## 2018-07-31 ENCOUNTER — Other Ambulatory Visit: Payer: Self-pay

## 2018-07-31 DIAGNOSIS — I495 Sick sinus syndrome: Secondary | ICD-10-CM | POA: Diagnosis not present

## 2018-08-01 LAB — CUP PACEART REMOTE DEVICE CHECK
Date Time Interrogation Session: 20200519065413
Implantable Lead Implant Date: 20171205
Implantable Lead Implant Date: 20171205
Implantable Lead Location: 753859
Implantable Lead Location: 753860
Implantable Lead Model: 377
Implantable Lead Model: 377
Implantable Lead Serial Number: 49684101
Implantable Lead Serial Number: 49719338
Implantable Pulse Generator Implant Date: 20171205
Pulse Gen Model: 407145
Pulse Gen Serial Number: 68906976

## 2018-08-04 ENCOUNTER — Telehealth: Payer: Self-pay | Admitting: *Deleted

## 2018-08-04 NOTE — Telephone Encounter (Signed)
Pt called due to trouble sleeping and would like to take Melatonin. Is this okay and what strength do you recommend?

## 2018-08-04 NOTE — Telephone Encounter (Signed)
Returned patient's call, left voice message on preferred number to return call with callback number.

## 2018-08-08 ENCOUNTER — Encounter: Payer: Self-pay | Admitting: Cardiology

## 2018-08-08 NOTE — Progress Notes (Signed)
Remote pacemaker transmission.   

## 2018-08-09 NOTE — Telephone Encounter (Signed)
Pls see previous note. 

## 2018-08-09 NOTE — Telephone Encounter (Signed)
Patient returned call, denies chest pain/pressure/squeeqing or other discomfort. Does report small amount of ankle edema which has been chronic. Denies SOB. BP runs in the 135/82 range.  Only complains of  insomnia. Dr. Dulce Sellar made aware, will respond to patient with his advice.

## 2018-08-10 NOTE — Telephone Encounter (Signed)
Phoned patient and informed that Dr. Dulce Sellar is recommending 0.3 mg melatonin at bedtime for insomnia. She verbalized understanding and had no additional questions or concerns.

## 2018-09-08 DIAGNOSIS — R7989 Other specified abnormal findings of blood chemistry: Secondary | ICD-10-CM

## 2018-09-08 HISTORY — DX: Other specified abnormal findings of blood chemistry: R79.89

## 2018-10-31 ENCOUNTER — Ambulatory Visit (INDEPENDENT_AMBULATORY_CARE_PROVIDER_SITE_OTHER): Payer: POS | Admitting: *Deleted

## 2018-10-31 DIAGNOSIS — I495 Sick sinus syndrome: Secondary | ICD-10-CM | POA: Diagnosis not present

## 2018-11-01 LAB — CUP PACEART REMOTE DEVICE CHECK
Date Time Interrogation Session: 20200819085122
Implantable Lead Implant Date: 20171205
Implantable Lead Implant Date: 20171205
Implantable Lead Location: 753859
Implantable Lead Location: 753860
Implantable Lead Model: 377
Implantable Lead Model: 377
Implantable Lead Serial Number: 49684101
Implantable Lead Serial Number: 49719338
Implantable Pulse Generator Implant Date: 20171205
Pulse Gen Model: 407145
Pulse Gen Serial Number: 68906976

## 2018-11-08 ENCOUNTER — Encounter: Payer: Self-pay | Admitting: Cardiology

## 2018-11-08 NOTE — Progress Notes (Signed)
Remote pacemaker transmission.   

## 2018-12-20 ENCOUNTER — Telehealth: Payer: Self-pay | Admitting: Cardiology

## 2018-12-20 NOTE — Telephone Encounter (Signed)
Confirmed that pt should be taking Toprol BID  Patient verbalized understanding and agreeable to plan.

## 2018-12-20 NOTE — Telephone Encounter (Signed)
New message  Pt c/o medication issue:  1. Name of Medication: metoprolol succinate (TOPROL-XL) 50 MG 24 hr tablet(Expired)  2. How are you currently taking this medication (dosage and times per day)? Twice daily  3. Are you having a reaction (difficulty breathing--STAT)? No   4. What is your medication issue?patient needs to verify dosage and the number of times daily. Please call to discuss.

## 2019-01-30 ENCOUNTER — Ambulatory Visit (INDEPENDENT_AMBULATORY_CARE_PROVIDER_SITE_OTHER): Payer: Medicare Other | Admitting: *Deleted

## 2019-01-30 DIAGNOSIS — I5032 Chronic diastolic (congestive) heart failure: Secondary | ICD-10-CM

## 2019-01-30 DIAGNOSIS — I255 Ischemic cardiomyopathy: Secondary | ICD-10-CM

## 2019-01-31 LAB — CUP PACEART REMOTE DEVICE CHECK
Date Time Interrogation Session: 20201118092448
Implantable Lead Implant Date: 20171205
Implantable Lead Implant Date: 20171205
Implantable Lead Location: 753859
Implantable Lead Location: 753860
Implantable Lead Model: 377
Implantable Lead Model: 377
Implantable Lead Serial Number: 49684101
Implantable Lead Serial Number: 49719338
Implantable Pulse Generator Implant Date: 20171205
Pulse Gen Model: 407145
Pulse Gen Serial Number: 68906976

## 2019-02-07 ENCOUNTER — Encounter: Payer: Self-pay | Admitting: Cardiology

## 2019-02-07 ENCOUNTER — Ambulatory Visit (INDEPENDENT_AMBULATORY_CARE_PROVIDER_SITE_OTHER): Payer: Medicare Other | Admitting: Cardiology

## 2019-02-07 ENCOUNTER — Other Ambulatory Visit: Payer: Self-pay

## 2019-02-07 VITALS — BP 120/82 | HR 85 | Ht 64.0 in | Wt 239.4 lb

## 2019-02-07 DIAGNOSIS — I255 Ischemic cardiomyopathy: Secondary | ICD-10-CM

## 2019-02-07 DIAGNOSIS — Z79899 Other long term (current) drug therapy: Secondary | ICD-10-CM

## 2019-02-07 DIAGNOSIS — I493 Ventricular premature depolarization: Secondary | ICD-10-CM | POA: Diagnosis not present

## 2019-02-07 DIAGNOSIS — R001 Bradycardia, unspecified: Secondary | ICD-10-CM

## 2019-02-07 DIAGNOSIS — Q245 Malformation of coronary vessels: Secondary | ICD-10-CM | POA: Diagnosis not present

## 2019-02-07 DIAGNOSIS — I2589 Other forms of chronic ischemic heart disease: Secondary | ICD-10-CM | POA: Diagnosis not present

## 2019-02-07 DIAGNOSIS — M7989 Other specified soft tissue disorders: Secondary | ICD-10-CM

## 2019-02-07 DIAGNOSIS — E785 Hyperlipidemia, unspecified: Secondary | ICD-10-CM

## 2019-02-07 DIAGNOSIS — I5032 Chronic diastolic (congestive) heart failure: Secondary | ICD-10-CM

## 2019-02-07 NOTE — Patient Instructions (Signed)
Medication Instructions:  Your physician recommends that you continue on your current medications as directed. Please refer to the Current Medication list given to you today.  *If you need a refill on your cardiac medications before your next appointment, please call your pharmacy*  Lab Work: Your physician recommends that you return for lab work today: CMP, ProBNP, lipid panel, CBC.   If you have labs (blood work) drawn today and your tests are completely normal, you will receive your results only by: Marland Kitchen MyChart Message (if you have MyChart) OR . A paper copy in the mail If you have any lab test that is abnormal or we need to change your treatment, we will call you to review the results.  Testing/Procedures: You had an EKG today.   Follow-Up: At [facility], you and your health needs are our priority.  As part of our continuing mission to provide you with exceptional heart care, we have created designated Provider Care Teams.  These Care Teams include your primary Cardiologist (physician) and Advanced Practice Providers (APPs -  Physician Assistants and Nurse Practitioners) who all work together to provide you with the care you need, when you need it.  Your next appointment:   6 month(s)  The format for your next appointment:   In Person  Provider:   Shirlee More, MD

## 2019-02-07 NOTE — Progress Notes (Signed)
Cardiology Office Note:    Date:  02/07/2019   ID:  Lindsey Murphy, DOB [birth year], MRN [id number]  PCP:  Maris Berger, MD  Cardiologist:  Shirlee More, MD    Referring MD: Maris Berger, MD    ASSESSMENT:    1. Cardiac microvascular disease   2. Congenital anomaly of coronary artery   3. Ischemic cardiomyopathy   4. Frequent PVCs   5. Severe sinus bradycardia   6. Hyperlipidemia, unspecified hyperlipidemia type   7. High risk medication use   8. Leg swelling   9. Chronic diastolic congestive heart failure ([place])    PLAN:    In order of problems listed above:  1. Stable having no anginal discomfort she will continue current medical treatment including clopidogrel high intensity statin beta-blocker. 2. 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 Stable no evidence of heart failure check proBNP if significantly elevated will need to repeat echocardiogram. 4. Markedly improved continue antiarrhythmic drug mexiletine check liver function for toxicity 5. Markedly improved with pacemaker normal parameters and function she was in atrial paced rhythm. 6. Stable continue high intensity statin with previous MI goal LDL less than 70 7. Continue antiarrhythmic drug no evidence of toxicity 8. Resolution of peripheral edema continue her low-dose diuretic and potassium check renal function potassium proBNP level 9. Stable compensated continue current diuretic check proBNP is significantly elevated I will recheck an echocardiogram   Next appointment: 6 months   Medication Adjustments/Labs and Tests Ordered: Current medicines are reviewed at length with the patient today.  Concerns regarding medicines are outlined above.  Orders Placed This Encounter  Procedures  . Comp Met (CMET)  . Pro b natriuretic peptide (BNP)  . Lipid Profile  . CBC  . EKG 12-Lead   No orders of  the defined types were placed in this encounter.   Chief Complaint  Patient presents with  . Follow-up    History of Present Illness:    Lindsey Murphy is a 63 y.o. 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 She was referred to [facility] for evaluation of her anomalous coronary vessel seen by cardiology EP cardiothoracic surgery has had a CT and MRI performed and a decision was made that there was no flow limitation and no indication for surgical intervention  last seen 03/02/2018. Compliance with diet, lifestyle and medications: Yes  Overall she has done much better she is on a good plateau in life she is not having palpitations syncope pacemaker awareness she has had no angina and no edema shortness of breath orthopnea.  She tolerates her medications and is pleased with the quality of life.  She has had no bleeding complication from her clopidogrel and tolerates her statin without muscle weakness or pain.  Pacemaker is followed in our device clinic last check 01/31/2019 with normal device parameters and function. Past Medical History:  Diagnosis Date  . Arthritis   . GERD (gastroesophageal reflux disease)   . Heart murmur   . Hx of Bell's palsy   . Hypertension   . Myocardial infarction ([place])    12/2012 - vasospasms   . Peripheral vascular disease ([place])   . Pink eye disease of left  eye    diagnosed 12/06/2014   . Pneumonia    hx of pneumonia   . Sleep apnea    cpap- does not know settings    Past Surgical History:  Procedure Laterality Date  . acterior cervical fusion     . left eye duct eye surgery     . right knee surgery     . TOTAL HIP ARTHROPLASTY Left 12/17/2014   Procedure: LEFT TOTAL HIP ARTHROPLASTY ANTERIOR APPROACH;  Surgeon: Paralee Cancel, MD;  Location: [facility];  Service: Orthopedics;  Laterality: Left;    Current Medications: Current Meds  Medication Sig  . amLODipine (NORVASC) 2.5 MG tablet Take 1 tablet (2.5 mg total) by mouth daily.  Marland Kitchen atorvastatin (LIPITOR) 10 MG tablet Take 1 tablet (10 mg total) by mouth daily.  . Azelastine HCl 0.15 % SOLN Place 1 spray into the nose 2 (two) times daily.   . Calcium Carb-Cholecalciferol (CALCIUM 600+D3) 600-800 MG-UNIT TABS Take 1 tablet by mouth 2 (two) times daily.  . Cholecalciferol (VITAMIN D PO) Take 5,000 Units by mouth daily.   . clopidogrel (PLAVIX) 75 MG tablet Take 1 tablet (75 mg total) by mouth daily.  . COSENTYX SENSOREADY 300 DOSE 150 MG/ML SOAJ Inject 300 mg into the skin every 28 (twenty-eight) days.   . fluticasone (FLONASE) 50 MCG/ACT nasal spray Place 2 sprays into both nostrils daily. Bedtime  . furosemide (LASIX) 20 MG tablet Take 20 mg by mouth 2 (two) times daily.   Marland Kitchen glucosamine-chondroitin (GLUCOSAMINE-CHONDROITIN DS) 500-400 MG tablet Take 1 tablet by mouth 2 (two) times daily.   Marland Kitchen guaiFENesin (MUCINEX) 600 MG 12 hr tablet Take 600 mg by mouth at bedtime.   . halobetasol (ULTRAVATE) 0.05 % ointment Apply 1 application topically 2 (two) times daily as needed.  . loratadine (CLARITIN) 10 MG tablet Take 10 mg by mouth daily.  . metoprolol succinate (TOPROL-XL) 50 MG 24 hr tablet Take 1 tablet (50 mg total) by mouth 2 (two) times daily. Take with or immediately following a meal.  . mexiletine (MEXITIL) 200 MG capsule Take 1 capsule (200 mg total) by mouth 2 (two) times daily.  . montelukast (SINGULAIR) 10 MG tablet Take 10 mg by mouth at bedtime.  . Multiple Vitamin (MULTIVITAMIN WITH MINERALS) TABS tablet Take 1 tablet by mouth daily.  . nitroGLYCERIN (NITROSTAT) 0.4 MG SL tablet Place 0.4 mg every 5 (five) minutes as needed under the tongue for chest pain.   . Omega-3 Fatty Acids (SALMON OIL-1000 PO) Take 1 tablet by mouth 2 (two) times daily.   . potassium chloride  (K-DUR,KLOR-CON) 10 MEQ tablet Take 1 tablet (10 mEq total) by mouth daily.  . vitamin C (ASCORBIC ACID) 500 MG tablet Take 500 mg by mouth daily.  . Wheat Dextrin (BENEFIBER PO) Take 1 Scoop by mouth daily.     Allergies:   Penicillins, Polymyxin b-trimethoprim, Tape, Etanercept, Lovastatin, Meloxicam, and Polymyxin b   Social History   Socioeconomic History  . Marital status: Married    Spouse name: Not on file  . Number of children: Not on file  . Years of education: Not on file  . Highest education level: Not on file  Occupational History  . Not on file  Social Needs  . Financial resource strain: Not on file  . Food insecurity    Worry: Not on file    Inability: Not on file  . Transportation needs    Medical: Not on file    Non-medical:  Not on file  Tobacco Use  . Smoking status: Never Smoker  . Smokeless tobacco: Never Used  Substance and Sexual Activity  . Alcohol use: No  . Drug use: No  . Sexual activity: Not on file  Lifestyle  . Physical activity    Days per week: Not on file    Minutes per session: Not on file  . Stress: Not on file  Relationships  . Social Herbalist on phone: Not on file    Gets together: Not on file    Attends religious service: Not on file    Active member of club or organization: Not on file    Attends meetings of clubs or organizations: Not on file    Relationship status: Not on file  Other Topics Concern  . Not on file  Social History Narrative  . Not on file     Family History: The patient's family history includes Heart attack in her maternal grandmother; Heart block in her mother; Stroke in her mother; Valvular heart disease in her mother. ROS:   Please see the history of present illness.    All other systems reviewed and are negative.  EKGs/Labs/Other Studies Reviewed:    The following studies were reviewed today:  EKG:  EKG ordered today and personally reviewed.  The ekg ordered today demonstrates atrial  paced normal QRS morphology sensed ventricular rhythm  Recent Labs: No results found for requested labs within last 8760 hours.  Recent Lipid Panel    Component Value Date/Time   CHOL 169 08/24/2017 1002   TRIG 135 08/24/2017 1002   HDL 55 08/24/2017 1002   CHOLHDL 3.1 08/24/2017 1002   LDLCALC 87 08/24/2017 1002    Physical Exam:    VS:  BP 120/82 (BP Location: Right Arm, Patient Position: Sitting, Cuff Size: Large)   Pulse 85   Ht 5' 4"  (1.626 m)   Wt 239 lb 6.4 oz (108.6 kg)   SpO2 95%   BMI 41.09 kg/m     Wt Readings from Last 3 Encounters:  02/07/19 239 lb 6.4 oz (108.6 kg)  04/24/18 241 lb (109.3 kg)  03/02/18 243 lb (110.2 kg)     GEN: She obviously has lost weight and looks much healthier well nourished, well developed in no acute distress HEENT: Normal NECK: No JVD; No carotid bruits LYMPHATICS: No lymphadenopathy CARDIAC: RRR, no murmurs, rubs, gallops RESPIRATORY:  Clear to auscultation without rales, wheezing or rhonchi  ABDOMEN: Soft, non-tender, non-distended MUSCULOSKELETAL:  No edema; No deformity  SKIN: Warm and dry NEUROLOGIC:  Alert and oriented x 3 PSYCHIATRIC:  Normal affect    Signed, Shirlee More, MD  02/07/2019 4:33 PM    [facility]

## 2019-02-08 LAB — COMPREHENSIVE METABOLIC PANEL
ALT: 36 IU/L — ABNORMAL HIGH (ref 0–32)
AST: 42 IU/L — ABNORMAL HIGH (ref 0–40)
Albumin/Globulin Ratio: 1.8 (ref 1.2–2.2)
Albumin: 4.8 g/dL (ref 3.8–4.8)
Alkaline Phosphatase: 106 IU/L (ref 39–117)
BUN/Creatinine Ratio: 22 (ref 12–28)
BUN: 18 mg/dL (ref 8–27)
Bilirubin Total: 0.6 mg/dL (ref 0.0–1.2)
CO2: 24 mmol/L (ref 20–29)
Calcium: 9.8 mg/dL (ref 8.7–10.3)
Chloride: 103 mmol/L (ref 96–106)
Creatinine, Ser: 0.82 mg/dL (ref 0.57–1.00)
GFR calc Af Amer: 89 mL/min/{1.73_m2} (ref >59)
GFR calc non Af Amer: 77 mL/min/{1.73_m2} (ref >59)
Globulin, Total: 2.7 g/dL (ref 1.5–4.5)
Glucose: 104 mg/dL — ABNORMAL HIGH (ref 65–99)
Potassium: 4.9 mmol/L (ref 3.5–5.2)
Sodium: 142 mmol/L (ref 134–144)
Total Protein: 7.5 g/dL (ref 6.0–8.5)

## 2019-02-08 LAB — CBC
Hematocrit: 46.3 % (ref 34.0–46.6)
Hemoglobin: 15.3 g/dL (ref 11.1–15.9)
MCH: 33.6 pg — ABNORMAL HIGH (ref 26.6–33.0)
MCHC: 33 g/dL (ref 31.5–35.7)
MCV: 102 fL — ABNORMAL HIGH (ref 79–97)
Platelets: 176 10*3/uL (ref 150–450)
RBC: 4.56 x10E6/uL (ref 3.77–5.28)
RDW: 12.2 % (ref 11.7–15.4)
WBC: 5.9 10*3/uL (ref 3.4–10.8)

## 2019-02-08 LAB — LIPID PANEL
Chol/HDL Ratio: 3.3 ratio (ref 0.0–4.4)
Cholesterol, Total: 180 mg/dL (ref 100–199)
HDL: 55 mg/dL (ref >39)
LDL Chol Calc (NIH): 94 mg/dL (ref 0–99)
Triglycerides: 183 mg/dL — ABNORMAL HIGH (ref 0–149)
VLDL Cholesterol Cal: 31 mg/dL (ref 5–40)

## 2019-02-08 LAB — PRO B NATRIURETIC PEPTIDE: NT-Pro BNP: 322 pg/mL — ABNORMAL HIGH (ref 0–287)

## 2019-02-26 DIAGNOSIS — I8002 Phlebitis and thrombophlebitis of superficial vessels of left lower extremity: Secondary | ICD-10-CM

## 2019-02-26 HISTORY — DX: Phlebitis and thrombophlebitis of superficial vessels of left lower extremity: I80.02

## 2019-02-26 NOTE — Progress Notes (Signed)
Remote pacemaker transmission.   

## 2019-03-20 DIAGNOSIS — I83813 Varicose veins of bilateral lower extremities with pain: Secondary | ICD-10-CM

## 2019-03-20 HISTORY — DX: Varicose veins of bilateral lower extremities with pain: I83.813

## 2019-04-19 ENCOUNTER — Other Ambulatory Visit: Payer: Self-pay

## 2019-04-19 MED ORDER — MEXILETINE HCL 200 MG PO CAPS: 200.0000 mg | ORAL_CAPSULE | Freq: Two times a day (BID) | ORAL | 0 refills | Status: DC

## 2019-05-01 ENCOUNTER — Ambulatory Visit (INDEPENDENT_AMBULATORY_CARE_PROVIDER_SITE_OTHER): Payer: Medicare Other | Admitting: *Deleted

## 2019-05-01 DIAGNOSIS — I255 Ischemic cardiomyopathy: Secondary | ICD-10-CM | POA: Diagnosis not present

## 2019-05-01 LAB — CUP PACEART REMOTE DEVICE CHECK
Date Time Interrogation Session: 20210216062246
Implantable Lead Implant Date: 20171205
Implantable Lead Implant Date: 20171205
Implantable Lead Location: 753859
Implantable Lead Location: 753860
Implantable Lead Model: 377
Implantable Lead Model: 377
Implantable Lead Serial Number: 49684101
Implantable Lead Serial Number: 49719338
Implantable Pulse Generator Implant Date: 20171205
Pulse Gen Model: 407145
Pulse Gen Serial Number: 68906976

## 2019-05-02 NOTE — Progress Notes (Signed)
PPM Remote  

## 2019-07-28 ENCOUNTER — Other Ambulatory Visit: Payer: Self-pay | Admitting: Cardiology

## 2019-07-31 ENCOUNTER — Ambulatory Visit (INDEPENDENT_AMBULATORY_CARE_PROVIDER_SITE_OTHER): Payer: Medicare Other | Admitting: *Deleted

## 2019-07-31 DIAGNOSIS — I495 Sick sinus syndrome: Secondary | ICD-10-CM | POA: Diagnosis not present

## 2019-07-31 LAB — CUP PACEART REMOTE DEVICE CHECK
Date Time Interrogation Session: 20210518132025
Implantable Lead Implant Date: 20171205
Implantable Lead Implant Date: 20171205
Implantable Lead Location: 753859
Implantable Lead Location: 753860
Implantable Lead Model: 377
Implantable Lead Model: 377
Implantable Lead Serial Number: 49684101
Implantable Lead Serial Number: 49719338
Implantable Pulse Generator Implant Date: 20171205
Pulse Gen Model: 407145
Pulse Gen Serial Number: 68906976

## 2019-08-01 NOTE — Progress Notes (Signed)
Remote pacemaker transmission.   

## 2019-08-07 ENCOUNTER — Other Ambulatory Visit: Payer: Self-pay

## 2019-08-07 DIAGNOSIS — G510 Bell's palsy: Secondary | ICD-10-CM

## 2019-08-07 DIAGNOSIS — G4730 Sleep apnea, unspecified: Secondary | ICD-10-CM | POA: Insufficient documentation

## 2019-08-07 DIAGNOSIS — J302 Other seasonal allergic rhinitis: Secondary | ICD-10-CM

## 2019-08-07 HISTORY — DX: Other seasonal allergic rhinitis: J30.2

## 2019-08-07 HISTORY — DX: Bell's palsy: G51.0

## 2019-08-07 NOTE — Progress Notes (Signed)
Cardiology Office Note:    Date:  08/08/2019   ID:  Lindsey Murphy, DOB 08/28/1955, MRN [id number]  PCP:  Maris Berger, MD  Cardiologist:  Shirlee More, MD    Referring MD: Maris Berger, MD    ASSESSMENT:    1. Chronic diastolic congestive heart failure ([place])   2. Congenital anomaly of coronary artery   3. Ischemic cardiomyopathy   4. Shortness of breath    PLAN:    In order of problems listed above:  1. Stable heart failure is compensated functional class I she has no fluid overload we will continue current low-dose loop diuretic along with beta-blocker. 2. Stable having no angina previously evaluated just not at high risk anatomy does not require coronary reimplantation 3. Stable will discuss rechecking echocardiogram for ejection fraction 4. Hyperlipidemia stable lipids at target 5. CAD stable continue current medical therapy including antiplatelet beta-blocker and lipid-lowering.   Next appointment: 6 months   Medication Adjustments/Labs and Tests Ordered: Current medicines are reviewed at length with the patient today.  Concerns regarding medicines are outlined above.  Orders Placed This Encounter  Procedures  . Comp Met (CMET)  . Lipid panel  . Pro b natriuretic peptide   No orders of the defined types were placed in this encounter.   Chief Complaint  Patient presents with  . Follow-up    6 MO FU   . Coronary Artery Disease  . Congestive Heart Failure    History of Present Illness:    Lindsey Murphy is a 64 y.o. 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 She was referred to [facility] for evaluation of her anomalous coronary vessel seen by cardiology EP cardiothoracic surgery has had a CT and MRI performed and a decision was made that there  was no flow limitation and no indication for surgical intervention  last seen 02/07/2019. Compliance with diet, lifestyle and medications: Yes CMP normal potassium 4.5 creatinine 0.88 normal liver function GFR 71 cc TSH 3.21 09/07/2019 cholesterol 160 LDL 86 HDL CBC hemoglobin 13.9 Recent labs [facility] 06/18/2019:   She continues to do well no hospitalizations is not having edema orthopnea shortness of breath no angina palpitation or syncope.  Tolerates lipid-lowering therapy without muscle pain or weakness and is having no GI upset dyspepsia and diarrhea. Past Medical History:  Diagnosis Date  . Acute cystitis 06/25/2016  . Arthritis   . Asthma with hay fever, mild intermittent, uncomplicated 0/10/6759  . Bell's palsy 08/07/2019  . Bile reflux esophagitis 04/29/2015  . BMI 40.0-44.9, adult ([place]) 08/23/2016  . Cellulitis of lower limb 05/24/2017  . Chest pain 06/25/2016  . Chronic diastolic congestive heart failure ([place]) 07/06/2016  . 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 Overview:  LCFx, benign variant Formatting of this note might be different from the original. LCFx, benign variant  . Coronary artery disease of native artery of native heart with stable angina pectoris ([place]) 04/29/2015  . Drug therapy 04/27/2015  . Frequent PVCs 10/05/2015   Overview:  With normal LV EF% on a beta blocker  Formatting of this note might be  different from the original. With normal LV EF% on a beta blocker  . GERD (gastroesophageal reflux disease)   . Hay fever 11/24/2015  . Heart murmur   . High risk medication use 04/27/2017   Flecanide  . History of hip replacement 12/17/2014  . History of repair of hip joint 12/17/2014  . Hx of Bell's palsy   . Hyperlipidemia 04/27/2015  . Hypertension   . Hypertensive heart disease with chronic  systolic congestive heart failure ([place]) 07/22/2016  . IBS (irritable bowel syndrome) 04/29/2015  . Ischemic cardiomyopathy 09/15/2016   Cath with nonSTEMI 06/25/2016 Conclusions:  Normal coronary arteries. Anomalous dominant circ from R sinus Mild inferior segmental LV systolic dysfunction  CTA 07/21/16 ejection fraction 46%  Formatting of this note might be different from the original. Overview:  Cath with nonSTEMI 06/25/2016 Conclusions:  Normal coronary arteries. Anomalous dominant circ from R sinus Mild inferior segmental LV sys  . LFT elevation 02/28/2017  . LVH (left ventricular hypertrophy) 04/29/2015  . Migraine 04/29/2015  . Mild CAD 11/21/2014  . Myocardial infarction ([place])    12/2012 - vasospasms   . NSTEMI (non-ST elevated myocardial infarction) ([place]) 06/25/2016  . Old MI (myocardial infarction) 04/27/2015   NSTEMI 2014 and recent NSTEMI 06/25/16      Formatting of this note might be different from the original. Overview:  NSTEMI 2014 and recent NSTEMI 06/25/16  . OSA on CPAP 04/29/2015  . Osteoarthritis 04/29/2015  . Pacemaker 02/18/2016   Dual chamber BIOTRONIC [place]       . Pacemaker lead failure 02/23/2016  . Pacemaker reprogramming/check 02/17/2016  . Pain in left knee 05/04/2017  . Pain in right foot 05/04/2017  . Peripheral vascular disease ([place])   . Pink eye disease of left eye    diagnosed 12/06/2014   . Pneumonia    hx of pneumonia   . Preoperative cardiovascular examination 11/21/2014  . Psoriasis 05/18/2015  . Psoriatic arthritis ([place]) 04/29/2015  . S/P left THA, AA 12/17/2014  . Sciatica, right side 04/29/2015  . Seasonal allergies 08/07/2019  . Severe sinus bradycardia 09/15/2016  . Sick sinus syndrome ([place]) 02/18/2016  . Sinus bradycardia 01/01/2016  . Sleep apnea    cpap- does not know settings  . Thrombophlebitis of superficial veins of left lower extremity 02/26/2019  . Thyroid nodule 03/16/2017  . TSH elevation 09/08/2018  . Varicose veins of bilateral lower extremities with  pain 03/20/2019  . Venous insufficiency 04/29/2015  . Vitamin D deficiency 04/29/2015    Past Surgical History:  Procedure Laterality Date  . acterior cervical fusion     . left eye duct eye surgery     . right knee surgery     . TOTAL HIP ARTHROPLASTY Left 12/17/2014   Procedure: LEFT TOTAL HIP ARTHROPLASTY ANTERIOR APPROACH;  Surgeon: Paralee Cancel, MD;  Location: [facility];  Service: Orthopedics;  Laterality: Left;    Current Medications: Current Meds  Medication Sig  . Albuterol Sulfate (PROAIR RESPICLICK) 081 (90 Base) MCG/ACT AEPB Inhale 2 puffs into the lungs every 4 (four) hours as needed.  Marland Kitchen amLODipine (NORVASC) 2.5 MG tablet Take 1 tablet (2.5 mg total) by mouth daily.  Marland Kitchen atorvastatin (LIPITOR) 10 MG tablet Take 1 tablet (10 mg total) by mouth daily.  . Azelastine HCl 0.15 % SOLN Place 1 spray into the nose 2 (two) times daily.   . Calcium Carb-Cholecalciferol (CALCIUM 600+D3) 600-800 MG-UNIT TABS Take 1 tablet by mouth 2 (two) times daily.  . calcium-vitamin D (OSCAL  WITH D) 500-200 MG-UNIT TABS tablet Take 1 tablet by mouth 2 (two) times daily.  . Cholecalciferol (VITAMIN D PO) Take 5,000 Units by mouth daily.   . clopidogrel (PLAVIX) 75 MG tablet Take 1 tablet (75 mg total) by mouth daily.  . COSENTYX SENSOREADY 300 DOSE 150 MG/ML SOAJ Inject 300 mg into the skin every 28 (twenty-eight) days.   . fluticasone (FLONASE) 50 MCG/ACT nasal spray Place 2 sprays into both nostrils daily. Bedtime  . furosemide (LASIX) 20 MG tablet Take 20 mg by mouth 2 (two) times daily.   Marland Kitchen glucosamine-chondroitin (GLUCOSAMINE-CHONDROITIN DS) 500-400 MG tablet Take 1 tablet by mouth 2 (two) times daily.   Marland Kitchen guaiFENesin (MUCINEX) 600 MG 12 hr tablet Take 600 mg by mouth at bedtime.   . halobetasol (ULTRAVATE) 0.05 % ointment Apply 1 application topically 2 (two) times daily as needed.  . loratadine (CLARITIN) 10 MG tablet Take 10 mg by mouth daily.  . metoprolol succinate (TOPROL-XL) 50 MG 24 hr tablet  Take 1 tablet (50 mg total) by mouth 2 (two) times daily. Take with or immediately following a meal.  . mexiletine (MEXITIL) 200 MG capsule TAKE 1 CAPSULE TWICE DAILY  . montelukast (SINGULAIR) 10 MG tablet Take 10 mg by mouth at bedtime.  . Multiple Vitamin (MULTIVITAMIN WITH MINERALS) TABS tablet Take 1 tablet by mouth daily.  . nitroGLYCERIN (NITROSTAT) 0.4 MG SL tablet Place 0.4 mg every 5 (five) minutes as needed under the tongue for chest pain.   . Omega-3 Fatty Acids (SALMON OIL-1000 PO) Take 1 tablet by mouth 2 (two) times daily.   . potassium chloride (K-DUR,KLOR-CON) 10 MEQ tablet Take 1 tablet (10 mEq total) by mouth daily.  . vitamin C (ASCORBIC ACID) 500 MG tablet Take 500 mg by mouth daily.     Allergies:   Penicillins, Polymyxin b-trimethoprim, Tape, Etanercept, Lovastatin, Meloxicam, and Polymyxin b   Social History   Socioeconomic History  . Marital status: Married    Spouse name: Not on file  . Number of children: Not on file  . Years of education: Not on file  . Highest education level: Not on file  Occupational History  . Not on file  Tobacco Use  . Smoking status: Never Smoker  . Smokeless tobacco: Never Used  Substance and Sexual Activity  . Alcohol use: No  . Drug use: No  . Sexual activity: Not on file  Other Topics Concern  . Not on file  Social History Narrative  . Not on file   Social Determinants of Health   Financial Resource Strain:   . Difficulty of Paying Living Expenses:   Food Insecurity:   . Worried About Charity fundraiser in the Last Year:   . Arboriculturist in the Last Year:   Transportation Needs:   . Film/video editor (Medical):   Marland Kitchen Lack of Transportation (Non-Medical):   Physical Activity:   . Days of Exercise per Week:   . Minutes of Exercise per Session:   Stress:   . Feeling of Stress :   Social Connections:   . Frequency of Communication with Friends and Family:   . Frequency of Social Gatherings with Friends and  Family:   . Attends Religious Services:   . Active Member of Clubs or Organizations:   . Attends Archivist Meetings:   Marland Kitchen Marital Status:      Family History: The patient's family history includes Heart attack in her maternal grandmother; Heart  block in her mother; Stroke in her mother; Valvular heart disease in her mother. ROS:   Please see the history of present illness.    All other systems reviewed and are negative.  EKGs/Labs/Other Studies Reviewed:    The following studies were reviewed today:  I reviewed her last pacemaker check 07/31/2019 she had normal function parameters and battery.  No episodes of atrial fibrillation  Recent Labs:   Ref Range & Units 6 mo ago 2 yr ago  NT-Pro BNP 0 - 287 pg/mL 322High   174     02/07/2019: ALT 36; BUN 18; Creatinine, Ser 0.82; Hemoglobin 15.3; NT-Pro BNP 322; Platelets 176; Potassium 4.9; Sodium 142  Recent Lipid Panel    Component Value Date/Time   CHOL 180 02/07/2019 1615   TRIG 183 (H) 02/07/2019 1615   HDL 55 02/07/2019 1615   CHOLHDL 3.3 02/07/2019 1615   LDLCALC 94 02/07/2019 1615    Physical Exam:    VS:  BP 122/80   Pulse 90   Ht _0  (1.626 m)   Wt 240 lb 12.8 oz (109.2 kg)   SpO2 98%   BMI 41.33 kg/m     Wt Readings from Last 3 Encounters:  08/08/19 240 lb 12.8 oz (109.2 kg)  02/07/19 239 lb 6.4 oz (108.6 kg)  04/24/18 241 lb (109.3 kg)     GEN:  Well nourished, well developed in no acute distress HEENT: Normal NECK: No JVD; No carotid bruits LYMPHATICS: No lymphadenopathy CARDIAC: RRR, no murmurs, rubs, gallops RESPIRATORY:  Clear to auscultation without rales, wheezing or rhonchi  ABDOMEN: Soft, non-tender, non-distended MUSCULOSKELETAL:  No edema; No deformity  SKIN: Warm and dry NEUROLOGIC:  Alert and oriented x 3 PSYCHIATRIC:  Normal affect    Signed, Shirlee More, MD  08/08/2019 10:47 AM    [place]

## 2019-08-08 ENCOUNTER — Other Ambulatory Visit: Payer: Self-pay

## 2019-08-08 ENCOUNTER — Ambulatory Visit (INDEPENDENT_AMBULATORY_CARE_PROVIDER_SITE_OTHER): Payer: Medicare Other | Admitting: Cardiology

## 2019-08-08 ENCOUNTER — Encounter: Payer: Self-pay | Admitting: Cardiology

## 2019-08-08 VITALS — BP 122/80 | HR 90 | Ht 64.0 in | Wt 240.8 lb

## 2019-08-08 DIAGNOSIS — I5032 Chronic diastolic (congestive) heart failure: Secondary | ICD-10-CM | POA: Diagnosis not present

## 2019-08-08 DIAGNOSIS — I255 Ischemic cardiomyopathy: Secondary | ICD-10-CM | POA: Diagnosis not present

## 2019-08-08 DIAGNOSIS — R0602 Shortness of breath: Secondary | ICD-10-CM | POA: Diagnosis not present

## 2019-08-08 DIAGNOSIS — Q245 Malformation of coronary vessels: Secondary | ICD-10-CM

## 2019-08-08 NOTE — Patient Instructions (Signed)
Medication Instructions:  Your physician recommends that you continue on your current medications as directed. Please refer to the Current Medication list given to you today.  *If you need a refill on your cardiac medications before your next appointment, please call your pharmacy*   Lab Work: Your physician recommends that you return for lab work in: TODAY CMP, Lipids, ProBNP If you have labs (blood work) drawn today and your tests are completely normal, you will receive your results only by: . MyChart Message (if you have MyChart) OR . A paper copy in the mail If you have any lab test that is abnormal or we need to change your treatment, we will call you to review the results.   Testing/Procedures: None   Follow-Up: At [facility], you and your health needs are our priority.  As part of our continuing mission to provide you with exceptional heart care, we have created designated Provider Care Teams.  These Care Teams include your primary Cardiologist (physician) and Advanced Practice Providers (APPs -  Physician Assistants and Nurse Practitioners) who all work together to provide you with the care you need, when you need it.  We recommend signing up for the patient portal called "MyChart".  Sign up information is provided on this After Visit Summary.  MyChart is used to connect with patients for Virtual Visits (Telemedicine).  Patients are able to view lab/test results, encounter notes, upcoming appointments, etc.  Non-urgent messages can be sent to your provider as well.   To learn more about what you can do with MyChart, go to [email or web address].    Your next appointment:   6 month(s)  The format for your next appointment:   In Person  Provider:   Brian Munley, MD   Other Instructions   

## 2019-08-09 ENCOUNTER — Telehealth: Payer: Self-pay

## 2019-08-09 LAB — COMPREHENSIVE METABOLIC PANEL
ALT: 29 IU/L (ref 0–32)
AST: 34 IU/L (ref 0–40)
Albumin/Globulin Ratio: 1.8 (ref 1.2–2.2)
Albumin: 4.3 g/dL (ref 3.8–4.8)
Alkaline Phosphatase: 83 IU/L (ref 48–121)
BUN/Creatinine Ratio: 21 (ref 12–28)
BUN: 17 mg/dL (ref 8–27)
Bilirubin Total: 0.5 mg/dL (ref 0.0–1.2)
CO2: 24 mmol/L (ref 20–29)
Calcium: 9.8 mg/dL (ref 8.7–10.3)
Chloride: 101 mmol/L (ref 96–106)
Creatinine, Ser: 0.82 mg/dL (ref 0.57–1.00)
GFR calc Af Amer: 89 mL/min/{1.73_m2} (ref >59)
GFR calc non Af Amer: 77 mL/min/{1.73_m2} (ref >59)
Globulin, Total: 2.4 g/dL (ref 1.5–4.5)
Glucose: 94 mg/dL (ref 65–99)
Potassium: 4.3 mmol/L (ref 3.5–5.2)
Sodium: 140 mmol/L (ref 134–144)
Total Protein: 6.7 g/dL (ref 6.0–8.5)

## 2019-08-09 LAB — LIPID PANEL
Chol/HDL Ratio: 3.1 ratio (ref 0.0–4.4)
Cholesterol, Total: 152 mg/dL (ref 100–199)
HDL: 49 mg/dL (ref >39)
LDL Chol Calc (NIH): 77 mg/dL (ref 0–99)
Triglycerides: 150 mg/dL — ABNORMAL HIGH (ref 0–149)
VLDL Cholesterol Cal: 26 mg/dL (ref 5–40)

## 2019-08-09 LAB — PRO B NATRIURETIC PEPTIDE: NT-Pro BNP: 252 pg/mL (ref 0–287)

## 2019-08-09 NOTE — Telephone Encounter (Signed)
Spoke with patient regarding results and recommendation.  Patient verbalizes understanding and is agreeable to plan of care. Advised patient to call back with any issues or concerns.  

## 2019-08-09 NOTE — Telephone Encounter (Signed)
-----   Message from Baldo Daub, MD sent at 08/09/2019  8:08 AM EDT ----- Normal or stable result  No changes

## 2019-10-30 ENCOUNTER — Ambulatory Visit (INDEPENDENT_AMBULATORY_CARE_PROVIDER_SITE_OTHER): Payer: Medicare Other | Admitting: *Deleted

## 2019-10-30 DIAGNOSIS — I495 Sick sinus syndrome: Secondary | ICD-10-CM

## 2019-10-30 LAB — CUP PACEART REMOTE DEVICE CHECK
Date Time Interrogation Session: 20210817075917
Implantable Lead Implant Date: 20171205
Implantable Lead Implant Date: 20171205
Implantable Lead Location: 753859
Implantable Lead Location: 753860
Implantable Lead Model: 377
Implantable Lead Model: 377
Implantable Lead Serial Number: 49684101
Implantable Lead Serial Number: 49719338
Implantable Pulse Generator Implant Date: 20171205
Pulse Gen Model: 407145
Pulse Gen Serial Number: 68906976

## 2019-11-01 NOTE — Progress Notes (Signed)
Remote pacemaker transmission.   

## 2019-11-26 ENCOUNTER — Encounter: Payer: Self-pay | Admitting: Cardiology

## 2019-11-26 ENCOUNTER — Other Ambulatory Visit: Payer: Self-pay

## 2019-11-26 ENCOUNTER — Ambulatory Visit (INDEPENDENT_AMBULATORY_CARE_PROVIDER_SITE_OTHER): Payer: Medicare Other | Admitting: Cardiology

## 2019-11-26 VITALS — BP 126/68 | HR 68 | Ht 64.5 in | Wt 236.0 lb

## 2019-11-26 DIAGNOSIS — I495 Sick sinus syndrome: Secondary | ICD-10-CM

## 2019-11-26 DIAGNOSIS — I493 Ventricular premature depolarization: Secondary | ICD-10-CM

## 2019-11-26 NOTE — Patient Instructions (Signed)
Medication Instructions:  Your physician recommends that you continue on your current medications as directed. Please refer to the Current Medication list given to you today.  *If you need a refill on your cardiac medications before your next appointment, please call your pharmacy*   Lab Work: None ordered If you have labs (blood work) drawn today and your tests are completely normal, you will receive your results only by: . MyChart Message (if you have MyChart) OR . A paper copy in the mail If you have any lab test that is abnormal or we need to change your treatment, we will call you to review the results.   Testing/Procedures: None ordered   Follow-Up: At [facility], you and your health needs are our priority.  As part of our continuing mission to provide you with exceptional heart care, we have created designated Provider Care Teams.  These Care Teams include your primary Cardiologist (physician) and Advanced Practice Providers (APPs -  Physician Assistants and Nurse Practitioners) who all work together to provide you with the care you need, when you need it.  We recommend signing up for the patient portal called "MyChart".  Sign up information is provided on this After Visit Summary.  MyChart is used to connect with patients for Virtual Visits (Telemedicine).  Patients are able to view lab/test results, encounter notes, upcoming appointments, etc.  Non-urgent messages can be sent to your provider as well.   To learn more about what you can do with MyChart, go to [email or web address].    Your next appointment:   6 month(s)  The format for your next appointment:   In Person  Provider:   Will Camnitz, MD   Thank you for choosing [facility]!!   Revan Gendron, RN [phone]    Other Instructions    

## 2019-11-26 NOTE — Progress Notes (Signed)
Electrophysiology Office Note   Date:  11/26/2019   ID:  Lindsey Murphy, DOB 26-Nov-1955, MRN [id number]  PCP:  Lindsey Cherry, MD  Cardiologist:  Dulce Sellar Primary Electrophysiologist:  Lindsey Murphy Lindsey Loa, MD    No chief complaint on file.    History of Present Illness: Lindsey Murphy is a 64 y.o. female who is being seen today for the evaluation of PVCs, systolic heart failure at the request of Lindsey Cherry, MD. Presenting today for electrophysiology evaluation.  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  She also has a history of diastolic heart failure, congenital coronary artery anomaly, and hyperlipidemia.  She also has a Biotronik pacemaker for symptomatic bradycardia.  She does have a history of PVCs.  PVC burden is at 8% based on her prior device interrogation.    Today, denies symptoms of palpitations, chest pain, shortness of breath, orthopnea, PND, lower extremity edema, claudication, dizziness, presyncope, syncope, bleeding, or neurologic sequela. The patient is tolerating medications without difficulties.  Since last being seen she has done well.  She has no chest pain or shortness of breath.  She has noted 1 day of short-lived palpitations but otherwise has not had much in the way of arrhythmia.  Past Medical History:  Diagnosis Date  . Acute cystitis 06/25/2016  . Arthritis   . Asthma with hay fever, mild intermittent, uncomplicated 05/31/2017  . Bell's palsy 08/07/2019  . Bile reflux esophagitis 04/29/2015  . BMI 40.0-44.9, adult (HCC) 08/23/2016  . Cellulitis of lower limb 05/24/2017  . Chest pain 06/25/2016  . Chronic diastolic congestive heart failure (HCC) 07/06/2016  . 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 Overview:  LCFx, benign variant Formatting of this note might be different from the original. LCFx, benign  variant  . Coronary artery disease of native artery of native heart with stable angina pectoris (HCC) 04/29/2015  . Drug therapy 04/27/2015  . Frequent PVCs 10/05/2015   Overview:  With normal LV EF% on a beta blocker  Formatting of this note might be different from the original. With normal LV EF% on a beta blocker  . GERD (gastroesophageal reflux disease)   . Hay fever 11/24/2015  . Heart murmur   . High risk medication use 04/27/2017   Flecanide  . History of hip replacement 12/17/2014  . History of repair of hip joint 12/17/2014  . Hx of Bell's palsy   . Hyperlipidemia 04/27/2015  . Hypertension   . Hypertensive heart disease with chronic systolic congestive heart failure (HCC) 07/22/2016  . IBS (irritable bowel syndrome) 04/29/2015  . Ischemic cardiomyopathy 09/15/2016   Cath with nonSTEMI 06/25/2016 Conclusions:  Normal coronary arteries. Anomalous dominant circ from R sinus Mild inferior segmental LV systolic dysfunction  CTA 07/21/16 ejection fraction 46%  Formatting of this note might be different from the original. Overview:  Cath with nonSTEMI 06/25/2016 Conclusions:  Normal coronary arteries. Anomalous dominant circ from R sinus Mild inferior segmental LV sys  . LFT elevation 02/28/2017  . LVH (left ventricular hypertrophy) 04/29/2015  . Migraine 04/29/2015  . Mild CAD 11/21/2014  . Myocardial infarction (HCC)    12/2012 - vasospasms   . NSTEMI (non-ST elevated myocardial infarction) (HCC) 06/25/2016  . Old MI (myocardial infarction) 04/27/2015   NSTEMI 2014 and recent NSTEMI 06/25/16      Formatting of this  note might be different from the original. Overview:  NSTEMI 2014 and recent NSTEMI 06/25/16  . OSA on CPAP 04/29/2015  . Osteoarthritis 04/29/2015  . Pacemaker 02/18/2016   Dual chamber BIOTRONIC [place]       . Pacemaker lead failure 02/23/2016  . Pacemaker reprogramming/check 02/17/2016  . Pain in left knee 05/04/2017  . Pain in right foot 05/04/2017  . Peripheral vascular disease (HCC)    . Pink eye disease of left eye    diagnosed 12/06/2014   . Pneumonia    hx of pneumonia   . Preoperative cardiovascular examination 11/21/2014  . Psoriasis 05/18/2015  . Psoriatic arthritis (HCC) 04/29/2015  . S/P left THA, AA 12/17/2014  . Sciatica, right side 04/29/2015  . Seasonal allergies 08/07/2019  . Severe sinus bradycardia 09/15/2016  . Sick sinus syndrome (HCC) 02/18/2016  . Sinus bradycardia 01/01/2016  . Sleep apnea    cpap- does not know settings  . Thrombophlebitis of superficial veins of left lower extremity 02/26/2019  . Thyroid nodule 03/16/2017  . TSH elevation 09/08/2018  . Varicose veins of bilateral lower extremities with pain 03/20/2019  . Venous insufficiency 04/29/2015  . Vitamin D deficiency 04/29/2015   Past Surgical History:  Procedure Laterality Date  . acterior cervical fusion     . left eye duct eye surgery     . right knee surgery     . TOTAL HIP ARTHROPLASTY Left 12/17/2014   Procedure: LEFT TOTAL HIP ARTHROPLASTY ANTERIOR APPROACH;  Surgeon: Durene Romans, MD;  Location: [facility];  Service: Orthopedics;  Laterality: Left;     Current Outpatient Medications  Medication Sig Dispense Refill  . Albuterol Sulfate (PROAIR RESPICLICK) 108 (90 Base) MCG/ACT AEPB Inhale 2 puffs into the lungs every 4 (four) hours as needed.    Marland Kitchen amLODipine (NORVASC) 2.5 MG tablet Take 1 tablet (2.5 mg total) by mouth daily. 90 tablet 3  . atorvastatin (LIPITOR) 10 MG tablet Take 1 tablet (10 mg total) by mouth daily. 90 tablet 3  . Azelastine HCl 0.15 % SOLN Place 1 spray into the nose 2 (two) times daily.     . Calcium Carb-Cholecalciferol (CALCIUM 600+D3) 600-800 MG-UNIT TABS Take 1 tablet by mouth 2 (two) times daily.    . calcium-vitamin D (OSCAL WITH D) 500-200 MG-UNIT TABS tablet Take 1 tablet by mouth 2 (two) times daily.    . Cholecalciferol (VITAMIN D PO) Take 5,000 Units by mouth daily.     . clopidogrel (PLAVIX) 75 MG tablet Take 1 tablet (75 mg total) by mouth daily. 90 tablet  3  . COSENTYX SENSOREADY 300 DOSE 150 MG/ML SOAJ Inject 300 mg into the skin every 28 (twenty-eight) days.     . fluticasone (FLONASE) 50 MCG/ACT nasal spray Place 2 sprays into both nostrils daily. Bedtime    . furosemide (LASIX) 20 MG tablet Take 20 mg by mouth 2 (two) times daily.     Marland Kitchen glucosamine-chondroitin (GLUCOSAMINE-CHONDROITIN DS) 500-400 MG tablet Take 1 tablet by mouth 2 (two) times daily.     Marland Kitchen guaiFENesin (MUCINEX) 600 MG 12 hr tablet Take 600 mg by mouth at bedtime.     . halobetasol (ULTRAVATE) 0.05 % ointment Apply 1 application topically 2 (two) times daily as needed.    . loratadine (CLARITIN) 10 MG tablet Take 10 mg by mouth daily.    . metoprolol succinate (TOPROL-XL) 50 MG 24 hr tablet Take 1 tablet (50 mg total) by mouth 2 (two) times daily. Take with or immediately  following a meal. 180 tablet 1  . mexiletine (MEXITIL) 200 MG capsule TAKE 1 CAPSULE TWICE DAILY 180 capsule 1  . montelukast (SINGULAIR) 10 MG tablet Take 10 mg by mouth at bedtime.    . Multiple Vitamin (MULTIVITAMIN WITH MINERALS) TABS tablet Take 1 tablet by mouth daily.    . nitroGLYCERIN (NITROSTAT) 0.4 MG SL tablet Place 0.4 mg every 5 (five) minutes as needed under the tongue for chest pain.     . Omega-3 Fatty Acids (SALMON OIL-1000 PO) Take 1 tablet by mouth 2 (two) times daily.     . pantoprazole (PROTONIX) 40 MG tablet Take 40 mg by mouth daily.    . potassium chloride (K-DUR,KLOR-CON) 10 MEQ tablet Take 1 tablet (10 mEq total) by mouth daily. 90 tablet 0  . potassium chloride (KLOR-CON) 10 MEQ tablet Take 10 mEq by mouth daily.    . vitamin C (ASCORBIC ACID) 500 MG tablet Take 500 mg by mouth daily.    . Wheat Dextrin (BENEFIBER PO) Take 1 Scoop by mouth daily.     No current facility-administered medications for this visit.    Allergies:   Penicillins, Polymyxin b-trimethoprim, Tape, Etanercept, Lovastatin, Meloxicam, and Polymyxin b   Social History:  The patient  reports that she has never  smoked. She has never used smokeless tobacco. She reports that she does not drink alcohol and does not use drugs.   Family History:  The patient's family history includes Heart attack in her maternal grandmother; Heart block in her mother; Stroke in her mother; Valvular heart disease in her mother.   ROS:  Please see the history of present illness.   Otherwise, review of systems is positive for none.   All other systems are reviewed and negative.   PHYSICAL EXAM: VS:  BP 126/68   Pulse 68   Ht 5' 4.5" (1.638 m)   Wt 236 lb (107 kg)   BMI 39.88 kg/m  , BMI Body mass index is 39.88 kg/m. GEN: Well nourished, well developed, in no acute distress  HEENT: normal  Neck: no JVD, carotid bruits, or masses Cardiac: RRR; no murmurs, rubs, or gallops,no edema  Respiratory:  clear to auscultation bilaterally, normal work of breathing GI: soft, nontender, nondistended, + BS MS: no deformity or atrophy  Skin: warm and dry, device site well healed Neuro:  Strength and sensation are intact Psych: euthymic mood, full affect  EKG:  EKG is ordered today. Personal review of the ekg ordered shows atrial paced, rate 68  Personal review of the device interrogation today. Results in [facility]   Recent Labs: 02/07/2019: Hemoglobin 15.3; Platelets 176 08/08/2019: ALT 29; BUN 17; Creatinine, Ser 0.82; NT-Pro BNP 252; Potassium 4.3; Sodium 140    Lipid Panel     Component Value Date/Time   CHOL 152 08/08/2019 1050   TRIG 150 (H) 08/08/2019 1050   HDL 49 08/08/2019 1050   CHOLHDL 3.1 08/08/2019 1050   LDLCALC 77 08/08/2019 1050     Wt Readings from Last 3 Encounters:  11/26/19 236 lb (107 kg)  08/08/19 240 lb 12.8 oz (109.2 kg)  02/07/19 239 lb 6.4 oz (108.6 kg)      Other studies Reviewed: Additional studies/ records that were reviewed today include: TTE  04/05/16 Review of the above records today demonstrates:  LV cavity mild to moderately dilated.  Ejection fraction 45-50% Mildly dilated  left atrium Trace aortic regurgitation Structurally normal mitral valve  ETT 02/02/17  Blood pressure demonstrated a hypertensive response  to exercise.  There was no ST segment deviation noted during stress.   Normal ETT No ischemia HTN response to exercise   ASSESSMENT AND PLAN:  1.  PVCs: Currently on mexiletine (monitoring performed for a high risk medication).  PVCs have been multifocal with a dominant morphology.  No changes.    2.  Mild systolic and diastolic heart failure: No obvious signs of volume overload.    3.  Hyperlipidemia: Continue statin per primary care.  4.  Congenital anomaly of coronary artery: Currently on Plavix, beta-blockers, statins.  No pain chest pain.  No changes.    5.  Symptomatic bradycardia: Status post Biotronik dual-chamber pacemaker.  Device functioning appropriately.  No changes at this time.  Current medicines are reviewed at length with the patient today.   The patient does not have concerns regarding her medicines.  The following changes were made today: none  Labs/ tests ordered today include:  Orders Placed This Encounter  Procedures  . EKG 12-Lead     Disposition:   FU with Chrsitopher Wik 6 months  Signed, Fischer Halley Lindsey Loa, MD  11/26/2019 11:51 AM     [facility] [address] [place] [address] [phone] (office) [phone] (fax)

## 2019-12-28 ENCOUNTER — Other Ambulatory Visit: Payer: Self-pay | Admitting: Cardiology

## 2020-01-29 ENCOUNTER — Ambulatory Visit (INDEPENDENT_AMBULATORY_CARE_PROVIDER_SITE_OTHER): Payer: Medicare Other

## 2020-01-29 DIAGNOSIS — I495 Sick sinus syndrome: Secondary | ICD-10-CM | POA: Diagnosis not present

## 2020-01-29 LAB — CUP PACEART REMOTE DEVICE CHECK
Date Time Interrogation Session: 20211116074012
Implantable Lead Implant Date: 20171205
Implantable Lead Implant Date: 20171205
Implantable Lead Location: 753859
Implantable Lead Location: 753860
Implantable Lead Model: 377
Implantable Lead Model: 377
Implantable Lead Serial Number: 49684101
Implantable Lead Serial Number: 49719338
Implantable Pulse Generator Implant Date: 20171205
Pulse Gen Model: 407145
Pulse Gen Serial Number: 68906976

## 2020-01-30 NOTE — Progress Notes (Signed)
Remote pacemaker transmission.   

## 2020-02-11 DIAGNOSIS — H10022 Other mucopurulent conjunctivitis, left eye: Secondary | ICD-10-CM | POA: Insufficient documentation

## 2020-02-11 DIAGNOSIS — I219 Acute myocardial infarction, unspecified: Secondary | ICD-10-CM | POA: Insufficient documentation

## 2020-02-11 DIAGNOSIS — M1990 Unspecified osteoarthritis, unspecified site: Secondary | ICD-10-CM | POA: Insufficient documentation

## 2020-02-11 DIAGNOSIS — Z8669 Personal history of other diseases of the nervous system and sense organs: Secondary | ICD-10-CM | POA: Insufficient documentation

## 2020-02-11 DIAGNOSIS — J189 Pneumonia, unspecified organism: Secondary | ICD-10-CM | POA: Insufficient documentation

## 2020-02-11 DIAGNOSIS — I10 Essential (primary) hypertension: Secondary | ICD-10-CM | POA: Insufficient documentation

## 2020-02-11 DIAGNOSIS — I739 Peripheral vascular disease, unspecified: Secondary | ICD-10-CM | POA: Insufficient documentation

## 2020-02-11 DIAGNOSIS — R011 Cardiac murmur, unspecified: Secondary | ICD-10-CM | POA: Insufficient documentation

## 2020-02-11 DIAGNOSIS — K219 Gastro-esophageal reflux disease without esophagitis: Secondary | ICD-10-CM | POA: Insufficient documentation

## 2020-02-17 NOTE — Progress Notes (Signed)
Cardiology Office Note:    Date:  02/18/2020   ID:  [name], DOB [birth year], MRN [id number]  PCP:  Everlean Cherry, MD  Cardiologist:  Norman Herrlich, MD    Referring MD: Everlean Cherry, MD    ASSESSMENT:    1. Congenital anomaly of coronary artery   2. Cardiac microvascular disease   3. Sick sinus syndrome (HCC)   4. PVC's (premature ventricular contractions)   5. Chronic diastolic congestive heart failure (HCC)   6. Mixed hyperlipidemia    PLAN:    In order of problems listed above:  1. Stable she has had extensive evaluation [facility] cardiothoracic surgery in the end she has benign variant and does not advised elective revascularization 2. Marked improvement at this time is not having anginal discomfort on current medical therapy continue clopidogrel beta-blocker 3. Stable with permanent pacemaker normal function followed by device clinic 4. Stable minimum symptomatology having no VT and she will continue mexiletine we will check screening labs including liver function along with renal function potassium 5. Compensated continue her current loop diuretic check proBNP 6. Stable hyperlipidemia check liver function lipid profile continue her statin   Next appointment: 6 months   Medication Adjustments/Labs and Tests Ordered: Current medicines are reviewed at length with the patient today.  Concerns regarding medicines are outlined above.  Orders Placed This Encounter  Procedures  . Comprehensive metabolic panel  . Lipid panel  . Pro b natriuretic peptide (BNP)   No orders of the defined types were placed in this encounter.   Chief Complaint  Patient presents with  . Follow-up    Sick sinus syndrome and pacemaker  . Coronary Artery Disease    Coronary microvascular disease  . Congestive Heart Failure    History of Present Illness:    Lindsey Murphy is a 64 y.o. female with a hx of 9 variant anomalous left circumflex coronary artery,MINOCA non-ST  elevation 2014 and 2018, heart failure mildly reduced ejection fraction 46%, bradycardia dual-chamber pacemaker, hypertensive heart disease and frequent PVCs.  She was last seen 08/08/2019.  Compliance with diet, lifestyle and medications: Yes  She continues to do well good quality of life very little palpitation no syncope no edema chest pain palpitation.  Unfortunately she is now caring for her mother with stroke dementia and generalized frailty.  She tolerates lipid-lowering therapy without muscle pain or weakness and her antiplatelet therapy without bruising or GI side effects.  She is followed by EP and device clinic Biotronik pacemaker for symptomatic bradycardia and a high PVC burden of 8%.  She was continued on a beta-blocker and was continued on mexiletine, antiarrhythmic drug.  proBNP level has remained low and stable:  Ref Range & Units 6 mo ago 1 yr ago 2 yr ago  NT-Pro BNP 0 - 287 pg/mL 252  322High CM  174 CM     Past Medical History:  Diagnosis Date  . Acute cystitis 06/25/2016  . Arthritis   . Asthma with hay fever, mild intermittent, uncomplicated 05/31/2017  . Bell's palsy 08/07/2019  . Bile reflux esophagitis 04/29/2015  . BMI 40.0-44.9, adult (HCC) 08/23/2016  . Cellulitis of lower limb 05/24/2017  . Chest pain 06/25/2016  . Chronic diastolic congestive heart failure (HCC) 07/06/2016  . 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 Overview:  LCFx,  benign variant Formatting of this note might be different from the original. LCFx, benign variant  . Coronary artery disease of native artery of native heart with stable angina pectoris (HCC) 04/29/2015  . Drug therapy 04/27/2015  . Frequent PVCs 10/05/2015   Overview:  With normal LV EF% on a beta blocker  Formatting of this note might be different from the original. With normal LV EF% on  a beta blocker  . GERD (gastroesophageal reflux disease)   . Hay fever 11/24/2015  . Heart murmur   . High risk medication use 04/27/2017   Flecanide  . History of hip replacement 12/17/2014  . History of repair of hip joint 12/17/2014  . Hx of Bell's palsy   . Hyperlipidemia 04/27/2015  . Hypertension   . Hypertensive heart disease with chronic systolic congestive heart failure (HCC) 07/22/2016  . IBS (irritable bowel syndrome) 04/29/2015  . Ischemic cardiomyopathy 09/15/2016   Cath with nonSTEMI 06/25/2016 Conclusions:  Normal coronary arteries. Anomalous dominant circ from R sinus Mild inferior segmental LV systolic dysfunction  CTA 07/21/16 ejection fraction 46%  Formatting of this note might be different from the original. Overview:  Cath with nonSTEMI 06/25/2016 Conclusions:  Normal coronary arteries. Anomalous dominant circ from R sinus Mild inferior segmental LV sys  . LFT elevation 02/28/2017  . LVH (left ventricular hypertrophy) 04/29/2015  . Migraine 04/29/2015  . Mild CAD 11/21/2014  . Myocardial infarction (HCC)    12/2012 - vasospasms   . NSTEMI (non-ST elevated myocardial infarction) (HCC) 06/25/2016  . Old MI (myocardial infarction) 04/27/2015   NSTEMI 2014 and recent NSTEMI 06/25/16      Formatting of this note might be different from the original. Overview:  NSTEMI 2014 and recent NSTEMI 06/25/16  . OSA on CPAP 04/29/2015  . Osteoarthritis 04/29/2015  . Pacemaker 02/18/2016   Dual chamber BIOTRONIC [place]       . Pacemaker lead failure 02/23/2016  . Pacemaker reprogramming/check 02/17/2016  . Pain in left knee 05/04/2017  . Pain in right foot 05/04/2017  . Peripheral vascular disease (HCC)   . Pink eye disease of left eye    diagnosed 12/06/2014   . Pneumonia    hx of pneumonia   . Preoperative cardiovascular examination 11/21/2014  . Psoriasis 05/18/2015  . Psoriatic arthritis (HCC) 04/29/2015  . S/P left THA, AA 12/17/2014  . Sciatica, right side 04/29/2015  . Seasonal allergies  08/07/2019  . Severe sinus bradycardia 09/15/2016  . Sick sinus syndrome (HCC) 02/18/2016  . Sinus bradycardia 01/01/2016  . Sleep apnea    cpap- does not know settings  . Thrombophlebitis of superficial veins of left lower extremity 02/26/2019  . Thyroid nodule 03/16/2017  . TSH elevation 09/08/2018  . Varicose veins of bilateral lower extremities with pain 03/20/2019  . Venous insufficiency 04/29/2015  . Vitamin D deficiency 04/29/2015    Past Surgical History:  Procedure Laterality Date  . acterior cervical fusion     . left eye duct eye surgery     . right knee surgery     . TOTAL HIP ARTHROPLASTY Left 12/17/2014   Procedure: LEFT TOTAL HIP ARTHROPLASTY ANTERIOR APPROACH;  Surgeon: Durene Romans, MD;  Location: [facility];  Service: Orthopedics;  Laterality: Left;    Current Medications: Current Meds  Medication Sig  . Albuterol Sulfate (PROAIR RESPICLICK) 108 (90 Base) MCG/ACT AEPB Inhale 2 puffs into the lungs every 4 (four) hours as needed.  Marland Kitchen amLODipine (NORVASC) 2.5 MG tablet Take 1 tablet (2.5 mg  total) by mouth daily.  Marland Kitchen atorvastatin (LIPITOR) 10 MG tablet Take 1 tablet (10 mg total) by mouth daily.  . Azelastine HCl 0.15 % SOLN Place 1 spray into the nose 2 (two) times daily.   . calcium-vitamin D (OSCAL WITH D) 500-200 MG-UNIT TABS tablet Take 1 tablet by mouth 2 (two) times daily.  . Cholecalciferol (VITAMIN D PO) Take 5,000 Units by mouth daily.   . clopidogrel (PLAVIX) 75 MG tablet Take 1 tablet (75 mg total) by mouth daily.  . COSENTYX SENSOREADY 300 DOSE 150 MG/ML SOAJ Inject 300 mg into the skin every 28 (twenty-eight) days.   . fluticasone (FLONASE) 50 MCG/ACT nasal spray Place 2 sprays into both nostrils daily. Bedtime  . furosemide (LASIX) 20 MG tablet Take 20 mg by mouth 2 (two) times daily.   Marland Kitchen glucosamine-chondroitin (GLUCOSAMINE-CHONDROITIN DS) 500-400 MG tablet Take 1 tablet by mouth 2 (two) times daily.   Marland Kitchen guaiFENesin (MUCINEX) 600 MG 12 hr tablet Take 600 mg by mouth  at bedtime.   . halobetasol (ULTRAVATE) 0.05 % ointment Apply 1 application topically 2 (two) times daily as needed.  . loratadine (CLARITIN) 10 MG tablet Take 10 mg by mouth daily.  . metoprolol succinate (TOPROL-XL) 50 MG 24 hr tablet Take 1 tablet (50 mg total) by mouth 2 (two) times daily. Take with or immediately following a meal.  . mexiletine (MEXITIL) 200 MG capsule TAKE 1 CAPSULE TWICE DAILY  . montelukast (SINGULAIR) 10 MG tablet Take 10 mg by mouth at bedtime.  . Multiple Vitamin (MULTIVITAMIN WITH MINERALS) TABS tablet Take 1 tablet by mouth daily.  . nitroGLYCERIN (NITROSTAT) 0.4 MG SL tablet Place 0.4 mg every 5 (five) minutes as needed under the tongue for chest pain.   . Omega-3 Fatty Acids (SALMON OIL-1000 PO) Take 1 tablet by mouth 2 (two) times daily.   . pantoprazole (PROTONIX) 40 MG tablet Take 40 mg by mouth daily.  . potassium chloride (K-DUR,KLOR-CON) 10 MEQ tablet Take 1 tablet (10 mEq total) by mouth daily.  . vitamin C (ASCORBIC ACID) 500 MG tablet Take 500 mg by mouth daily.  . Wheat Dextrin (BENEFIBER PO) Take 1 Scoop by mouth daily.  . [DISCONTINUED] Calcium Carb-Cholecalciferol (CALCIUM 600+D3) 600-800 MG-UNIT TABS Take 1 tablet by mouth 2 (two) times daily.     Allergies:   Penicillins, Polymyxin b-trimethoprim, Tape, Etanercept, Lovastatin, Meloxicam, and Polymyxin b   Social History   Socioeconomic History  . Marital status: Married    Spouse name: Not on file  . Number of children: Not on file  . Years of education: Not on file  . Highest education level: Not on file  Occupational History  . Not on file  Tobacco Use  . Smoking status: Never Smoker  . Smokeless tobacco: Never Used  Vaping Use  . Vaping Use: Never used  Substance and Sexual Activity  . Alcohol use: No  . Drug use: No  . Sexual activity: Not on file  Other Topics Concern  . Not on file  Social History Narrative  . Not on file   Social Determinants of Health   Financial  Resource Strain:   . Difficulty of Paying Living Expenses: Not on file  Food Insecurity:   . Worried About Programme researcher, broadcasting/film/video in the Last Year: Not on file  . Ran Out of Food in the Last Year: Not on file  Transportation Needs:   . Lack of Transportation (Medical): Not on file  . Lack of  Transportation (Non-Medical): Not on file  Physical Activity:   . Days of Exercise per Week: Not on file  . Minutes of Exercise per Session: Not on file  Stress:   . Feeling of Stress : Not on file  Social Connections:   . Frequency of Communication with Friends and Family: Not on file  . Frequency of Social Gatherings with Friends and Family: Not on file  . Attends Religious Services: Not on file  . Active Member of Clubs or Organizations: Not on file  . Attends Banker Meetings: Not on file  . Marital Status: Not on file     Family History: The patient's family history includes Heart attack in her maternal grandmother; Heart block in her mother; Stroke in her mother; Valvular heart disease in her mother. ROS:   Please see the history of present illness.    All other systems reviewed and are negative.  EKGs/Labs/Other Studies Reviewed:    The following studies were reviewed today:    Recent Labs: 08/08/2019: ALT 29; BUN 17; Creatinine, Ser 0.82; NT-Pro BNP 252; Potassium 4.3; Sodium 140  Recent Lipid Panel    Component Value Date/Time   CHOL 152 08/08/2019 1050   TRIG 150 (H) 08/08/2019 1050   HDL 49 08/08/2019 1050   CHOLHDL 3.1 08/08/2019 1050   LDLCALC 77 08/08/2019 1050    Physical Exam:    VS:  BP 110/70   Pulse 68   Ht 5' 4.5" (1.638 m)   Wt 239 lb (108.4 kg)   SpO2 95%   BMI 40.39 kg/m     Wt Readings from Last 3 Encounters:  02/18/20 239 lb (108.4 kg)  11/26/19 236 lb (107 kg)  08/08/19 240 lb 12.8 oz (109.2 kg)     GEN:  Well nourished, well developed in no acute distress HEENT: Normal NECK: No JVD; No carotid bruits LYMPHATICS: No  lymphadenopathy CARDIAC: RRR, no murmurs, rubs, gallops RESPIRATORY:  Clear to auscultation without rales, wheezing or rhonchi  ABDOMEN: Soft, non-tender, non-distended MUSCULOSKELETAL:  No edema; No deformity  SKIN: Warm and dry NEUROLOGIC:  Alert and oriented x 3 PSYCHIATRIC:  Normal affect    Signed, Norman Herrlich, MD  02/18/2020 11:55 AM    [facility]

## 2020-02-18 ENCOUNTER — Ambulatory Visit (INDEPENDENT_AMBULATORY_CARE_PROVIDER_SITE_OTHER): Payer: Medicare Other | Admitting: Cardiology

## 2020-02-18 ENCOUNTER — Encounter: Payer: Self-pay | Admitting: Cardiology

## 2020-02-18 ENCOUNTER — Other Ambulatory Visit: Payer: Self-pay

## 2020-02-18 VITALS — BP 110/70 | HR 68 | Ht 64.5 in | Wt 239.0 lb

## 2020-02-18 DIAGNOSIS — I5032 Chronic diastolic (congestive) heart failure: Secondary | ICD-10-CM

## 2020-02-18 DIAGNOSIS — I493 Ventricular premature depolarization: Secondary | ICD-10-CM

## 2020-02-18 DIAGNOSIS — I2589 Other forms of chronic ischemic heart disease: Secondary | ICD-10-CM

## 2020-02-18 DIAGNOSIS — I495 Sick sinus syndrome: Secondary | ICD-10-CM | POA: Diagnosis not present

## 2020-02-18 DIAGNOSIS — I2585 Chronic coronary microvascular dysfunction: Secondary | ICD-10-CM

## 2020-02-18 DIAGNOSIS — Q245 Malformation of coronary vessels: Secondary | ICD-10-CM

## 2020-02-18 DIAGNOSIS — E782 Mixed hyperlipidemia: Secondary | ICD-10-CM

## 2020-02-18 NOTE — Patient Instructions (Signed)
Medication Instructions:  Your physician recommends that you continue on your current medications as directed. Please refer to the Current Medication list given to you today.  *If you need a refill on your cardiac medications before your next appointment, please call your pharmacy*   Lab Work: Your physician recommends that you return for lab work in: TODAY CMP, Lipids, ProBNP If you have labs (blood work) drawn today and your tests are completely normal, you will receive your results only by: . MyChart Message (if you have MyChart) OR . A paper copy in the mail If you have any lab test that is abnormal or we need to change your treatment, we will call you to review the results.   Testing/Procedures: None   Follow-Up: At [facility], you and your health needs are our priority.  As part of our continuing mission to provide you with exceptional heart care, we have created designated Provider Care Teams.  These Care Teams include your primary Cardiologist (physician) and Advanced Practice Providers (APPs -  Physician Assistants and Nurse Practitioners) who all work together to provide you with the care you need, when you need it.  We recommend signing up for the patient portal called "MyChart".  Sign up information is provided on this After Visit Summary.  MyChart is used to connect with patients for Virtual Visits (Telemedicine).  Patients are able to view lab/test results, encounter notes, upcoming appointments, etc.  Non-urgent messages can be sent to your provider as well.   To learn more about what you can do with MyChart, go to [email or web address].    Your next appointment:   6 month(s)  The format for your next appointment:   In Person  Provider:   Brian Munley, MD   Other Instructions   

## 2020-02-19 ENCOUNTER — Telehealth: Payer: Self-pay

## 2020-02-19 LAB — COMPREHENSIVE METABOLIC PANEL
ALT: 32 IU/L (ref 0–32)
AST: 34 IU/L (ref 0–40)
Albumin/Globulin Ratio: 1.9 (ref 1.2–2.2)
Albumin: 4.5 g/dL (ref 3.8–4.8)
Alkaline Phosphatase: 91 IU/L (ref 44–121)
BUN/Creatinine Ratio: 21 (ref 12–28)
BUN: 16 mg/dL (ref 8–27)
Bilirubin Total: 0.6 mg/dL (ref 0.0–1.2)
CO2: 25 mmol/L (ref 20–29)
Calcium: 9.3 mg/dL (ref 8.7–10.3)
Chloride: 104 mmol/L (ref 96–106)
Creatinine, Ser: 0.77 mg/dL (ref 0.57–1.00)
GFR calc Af Amer: 96 mL/min/{1.73_m2} (ref >59)
GFR calc non Af Amer: 83 mL/min/{1.73_m2} (ref >59)
Globulin, Total: 2.4 g/dL (ref 1.5–4.5)
Glucose: 106 mg/dL — ABNORMAL HIGH (ref 65–99)
Potassium: 4.5 mmol/L (ref 3.5–5.2)
Sodium: 142 mmol/L (ref 134–144)
Total Protein: 6.9 g/dL (ref 6.0–8.5)

## 2020-02-19 LAB — LIPID PANEL
Chol/HDL Ratio: 2.8 ratio (ref 0.0–4.4)
Cholesterol, Total: 157 mg/dL (ref 100–199)
HDL: 56 mg/dL (ref >39)
LDL Chol Calc (NIH): 79 mg/dL (ref 0–99)
Triglycerides: 128 mg/dL (ref 0–149)
VLDL Cholesterol Cal: 22 mg/dL (ref 5–40)

## 2020-02-19 LAB — PRO B NATRIURETIC PEPTIDE: NT-Pro BNP: 260 pg/mL (ref 0–287)

## 2020-02-19 NOTE — Telephone Encounter (Signed)
-----   Message from Baldo Daub, MD sent at 02/19/2020  7:59 AM EST ----- All are good no changes in treatment

## 2020-02-19 NOTE — Telephone Encounter (Signed)
Spoke with patient regarding results and recommendation.  Patient verbalizes understanding and is agreeable to plan of care. Advised patient to call back with any issues or concerns.  

## 2020-03-09 ENCOUNTER — Other Ambulatory Visit: Payer: Self-pay | Admitting: Cardiology

## 2020-04-29 ENCOUNTER — Ambulatory Visit (INDEPENDENT_AMBULATORY_CARE_PROVIDER_SITE_OTHER): Payer: Medicare Other

## 2020-04-29 DIAGNOSIS — I495 Sick sinus syndrome: Secondary | ICD-10-CM | POA: Diagnosis not present

## 2020-04-29 LAB — CUP PACEART REMOTE DEVICE CHECK
Date Time Interrogation Session: 20220214165110
Implantable Lead Implant Date: 20171205
Implantable Lead Implant Date: 20171205
Implantable Lead Location: 753859
Implantable Lead Location: 753860
Implantable Lead Model: 377
Implantable Lead Model: 377
Implantable Lead Serial Number: 49684101
Implantable Lead Serial Number: 49719338
Implantable Pulse Generator Implant Date: 20171205
Pulse Gen Model: 407145
Pulse Gen Serial Number: 68906976

## 2020-05-05 NOTE — Progress Notes (Signed)
Remote pacemaker transmission.   

## 2020-05-18 NOTE — Progress Notes (Unsigned)
Electrophysiology Office Note   Date:  05/19/2020   ID:  Lindsey Murphy, DOB May 06, 1955, MRN [id number]  PCP:  Everlean Cherry, MD  Cardiologist:  Dulce Sellar Primary Electrophysiologist:  Will Jorja Loa, MD    No chief complaint on file.    History of Present Illness: Lindsey Murphy is a 65 y.o. female who is being seen today for the evaluation of PVCs, systolic heart failure at the request of Everlean Cherry, MD. Presenting today for electrophysiology evaluation.    She has a history significant for diastolic heart failure, congenital coronary anomaly, hyperlipidemia, symptomatic bradycardia status post Biotronik pacemaker, and PVCs.  She had approximately 8% PVC burden on her last device interrogation.  She is currently on mexiletine.  Today, denies symptoms of palpitations, chest pain, shortness of breath, orthopnea, PND, lower extremity edema, claudication, dizziness, presyncope, syncope, bleeding, or neurologic sequela. The patient is tolerating medications without difficulties.  Again she has done well.  She has no chest pain or shortness of breath.  She has a persistent cough.  This is due to her Covid infection the beginning of February.  She is fully recovered other than the cough.  She has no other complaints.  Past Medical History:  Diagnosis Date  . Acute cystitis 06/25/2016  . Arthritis   . Asthma with hay fever, mild intermittent, uncomplicated 05/31/2017  . Bell's palsy 08/07/2019  . Bile reflux esophagitis 04/29/2015  . BMI 40.0-44.9, adult (HCC) 08/23/2016  . Cellulitis of lower limb 05/24/2017  . Chest pain 06/25/2016  . Chronic diastolic congestive heart failure (HCC) 07/06/2016  . 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 Overview:  LCFx, benign variant Formatting of this note might be different from the original. LCFx,  benign variant  . Coronary artery disease of native artery of native heart with stable angina pectoris (HCC) 04/29/2015  . Drug therapy 04/27/2015  . Frequent PVCs 10/05/2015   Overview:  With normal LV EF% on a beta blocker  Formatting of this note might be different from the original. With normal LV EF% on a beta blocker  . GERD (gastroesophageal reflux disease)   . Hay fever 11/24/2015  . Heart murmur   . High risk medication use 04/27/2017   Flecanide  . History of hip replacement 12/17/2014  . History of repair of hip joint 12/17/2014  . Hx of Bell's palsy   . Hyperlipidemia 04/27/2015  . Hypertension   . Hypertensive heart disease with chronic systolic congestive heart failure (HCC) 07/22/2016  . IBS (irritable bowel syndrome) 04/29/2015  . Ischemic cardiomyopathy 09/15/2016   Cath with nonSTEMI 06/25/2016 Conclusions:  Normal coronary arteries. Anomalous dominant circ from R sinus Mild inferior segmental LV systolic dysfunction  CTA 07/21/16 ejection fraction 46%  Formatting of this note might be different from the original. Overview:  Cath with nonSTEMI 06/25/2016 Conclusions:  Normal coronary arteries. Anomalous dominant circ from R sinus Mild inferior segmental LV sys  . LFT elevation 02/28/2017  . LVH (left ventricular hypertrophy) 04/29/2015  . Migraine 04/29/2015  . Mild CAD 11/21/2014  . Myocardial infarction (HCC)    12/2012 - vasospasms   . NSTEMI (non-ST elevated myocardial infarction) (HCC) 06/25/2016  . Old MI (myocardial infarction) 04/27/2015   NSTEMI 2014 and recent NSTEMI 06/25/16      Formatting of this note might be different  from the original. Overview:  NSTEMI 2014 and recent NSTEMI 06/25/16  . OSA on CPAP 04/29/2015  . Osteoarthritis 04/29/2015  . Pacemaker 02/18/2016   Dual chamber BIOTRONIC [place]       . Pacemaker lead failure 02/23/2016  . Pacemaker reprogramming/check 02/17/2016  . Pain in left knee 05/04/2017  . Pain in right foot 05/04/2017  . Peripheral vascular disease  (HCC)   . Pink eye disease of left eye    diagnosed 12/06/2014   . Pneumonia    hx of pneumonia   . Preoperative cardiovascular examination 11/21/2014  . Psoriasis 05/18/2015  . Psoriatic arthritis (HCC) 04/29/2015  . S/P left THA, AA 12/17/2014  . Sciatica, right side 04/29/2015  . Seasonal allergies 08/07/2019  . Severe sinus bradycardia 09/15/2016  . Sick sinus syndrome (HCC) 02/18/2016  . Sinus bradycardia 01/01/2016  . Sleep apnea    cpap- does not know settings  . Thrombophlebitis of superficial veins of left lower extremity 02/26/2019  . Thyroid nodule 03/16/2017  . TSH elevation 09/08/2018  . Varicose veins of bilateral lower extremities with pain 03/20/2019  . Venous insufficiency 04/29/2015  . Vitamin D deficiency 04/29/2015   Past Surgical History:  Procedure Laterality Date  . acterior cervical fusion     . left eye duct eye surgery     . right knee surgery     . TOTAL HIP ARTHROPLASTY Left 12/17/2014   Procedure: LEFT TOTAL HIP ARTHROPLASTY ANTERIOR APPROACH;  Surgeon: Durene Romans, MD;  Location: [facility];  Service: Orthopedics;  Laterality: Left;     Current Outpatient Medications  Medication Sig Dispense Refill  . Albuterol Sulfate (PROAIR RESPICLICK) 108 (90 Base) MCG/ACT AEPB Inhale 2 puffs into the lungs every 4 (four) hours as needed.    Marland Kitchen amLODipine (NORVASC) 2.5 MG tablet Take 1 tablet (2.5 mg total) by mouth daily. 90 tablet 3  . atorvastatin (LIPITOR) 10 MG tablet Take 1 tablet (10 mg total) by mouth daily. 90 tablet 3  . Azelastine HCl 0.15 % SOLN Place 1 spray into the nose 2 (two) times daily.     . calcium-vitamin D (OSCAL WITH D) 500-200 MG-UNIT TABS tablet Take 1 tablet by mouth 2 (two) times daily.    . Cholecalciferol (VITAMIN D PO) Take 5,000 Units by mouth daily.     . clopidogrel (PLAVIX) 75 MG tablet Take 1 tablet (75 mg total) by mouth daily. 90 tablet 3  . COSENTYX SENSOREADY 300 DOSE 150 MG/ML SOAJ Inject 300 mg into the skin every 28 (twenty-eight) days.      . fluticasone (FLONASE) 50 MCG/ACT nasal spray Place 2 sprays into both nostrils daily. Bedtime    . furosemide (LASIX) 20 MG tablet Take 20 mg by mouth 2 (two) times daily.     Marland Kitchen glucosamine-chondroitin 500-400 MG tablet Take 1 tablet by mouth 2 (two) times daily.     Marland Kitchen guaiFENesin (MUCINEX) 600 MG 12 hr tablet Take 600 mg by mouth at bedtime.     . halobetasol (ULTRAVATE) 0.05 % ointment Apply 1 application topically 2 (two) times daily as needed.    . loratadine (CLARITIN) 10 MG tablet Take 10 mg by mouth daily.    . metoprolol succinate (TOPROL-XL) 50 MG 24 hr tablet Take 1 tablet (50 mg total) by mouth 2 (two) times daily. Take with or immediately following a meal. 180 tablet 1  . mexiletine (MEXITIL) 200 MG capsule Take 1 capsule (200 mg total) by mouth 2 (two) times daily. 180  capsule 3  . montelukast (SINGULAIR) 10 MG tablet Take 10 mg by mouth at bedtime.    . Multiple Vitamin (MULTIVITAMIN WITH MINERALS) TABS tablet Take 1 tablet by mouth daily.    . nitroGLYCERIN (NITROSTAT) 0.4 MG SL tablet Place 0.4 mg every 5 (five) minutes as needed under the tongue for chest pain.     . Omega-3 Fatty Acids (SALMON OIL-1000 PO) Take 1 tablet by mouth 2 (two) times daily.     . pantoprazole (PROTONIX) 40 MG tablet Take 40 mg by mouth daily.    . potassium chloride (K-DUR,KLOR-CON) 10 MEQ tablet Take 1 tablet (10 mEq total) by mouth daily. 90 tablet 0  . vitamin C (ASCORBIC ACID) 500 MG tablet Take 500 mg by mouth daily.    . Wheat Dextrin (BENEFIBER PO) Take 1 Scoop by mouth daily.     No current facility-administered medications for this visit.    Allergies:   Penicillins, Polymyxin b-trimethoprim, Tape, Etanercept, Lovastatin, Meloxicam, and Polymyxin b   Social History:  The patient  reports that she has never smoked. She has never used smokeless tobacco. She reports that she does not drink alcohol and does not use drugs.   Family History:  The patient's family history includes Heart attack  in her maternal grandmother; Heart block in her mother; Stroke in her mother; Valvular heart disease in her mother.   ROS:  Please see the history of present illness.   Otherwise, review of systems is positive for none.   All other systems are reviewed and negative.   PHYSICAL EXAM: VS:  BP 112/72   Pulse 66   Ht 5\' 4"  (1.626 m)   Wt 224 lb (101.6 kg)   SpO2 98%   BMI 38.45 kg/m  , BMI Body mass index is 38.45 kg/m. GEN: Well nourished, well developed, in no acute distress  HEENT: normal  Neck: no JVD, carotid bruits, or masses Cardiac: RRR; no murmurs, rubs, or gallops,no edema  Respiratory:  clear to auscultation bilaterally, normal work of breathing GI: soft, nontender, nondistended, + BS MS: no deformity or atrophy  Skin: warm and dry, device site well healed Neuro:  Strength and sensation are intact Psych: euthymic mood, full affect  EKG:  EKG is ordered today. Personal review of the ekg ordered shows atrial paced, rate 66  Personal review of the device interrogation today. Results in [facility]   Recent Labs: 02/18/2020: ALT 32; BUN 16; Creatinine, Ser 0.77; NT-Pro BNP 260; Potassium 4.5; Sodium 142    Lipid Panel     Component Value Date/Time   CHOL 157 02/18/2020 1156   TRIG 128 02/18/2020 1156   HDL 56 02/18/2020 1156   CHOLHDL 2.8 02/18/2020 1156   LDLCALC 79 02/18/2020 1156     Wt Readings from Last 3 Encounters:  05/19/20 224 lb (101.6 kg)  02/18/20 239 lb (108.4 kg)  11/26/19 236 lb (107 kg)      Other studies Reviewed: Additional studies/ records that were reviewed today include: TTE  04/05/16 Review of the above records today demonstrates:  LV cavity mild to moderately dilated.  Ejection fraction 45-50% Mildly dilated left atrium Trace aortic regurgitation Structurally normal mitral valve  ETT 02/02/17  Blood pressure demonstrated a hypertensive response to exercise.  There was no ST segment deviation noted during stress.   Normal ETT No  ischemia HTN response to exercise   ASSESSMENT AND PLAN:  1.  PVCs: Currently on mexiletine.  High risk medication monitoring.  PVCs  have been multifocal with a dominant morphology.  Currently feeling well.  No changes.  2.  Mild systolic and diastolic heart failure: No signs of volume overload.  3.  Hyperlipidemia: Continue statin per primary.  4.  Congenital anomaly of coronary artery: Currently on Plavix, beta-blockers, statin.  No current chest pain.  No changes.    5.  Symptomatic bradycardia: Status post Biotronik dual-chamber pacemaker.  Device functioning appropriately.  No changes at this time.  Current medicines are reviewed at length with the patient today.   The patient does not have concerns regarding her medicines.  The following changes were made today: None  Labs/ tests ordered today include:  Orders Placed This Encounter  Procedures  . EKG 12-Lead     Disposition:   FU with Will Camnitz 6 months  Signed, Will Jorja Loa, MD  05/19/2020 11:16 AM     [facility] [address] [place] [address] [phone] (office) [phone] (fax)

## 2020-05-19 ENCOUNTER — Ambulatory Visit (INDEPENDENT_AMBULATORY_CARE_PROVIDER_SITE_OTHER): Payer: Medicare Other | Admitting: Cardiology

## 2020-05-19 ENCOUNTER — Other Ambulatory Visit: Payer: Self-pay

## 2020-05-19 ENCOUNTER — Encounter: Payer: Self-pay | Admitting: Cardiology

## 2020-05-19 VITALS — BP 112/72 | HR 66 | Ht 64.0 in | Wt 224.0 lb

## 2020-05-19 DIAGNOSIS — I495 Sick sinus syndrome: Secondary | ICD-10-CM | POA: Diagnosis not present

## 2020-05-19 DIAGNOSIS — Z950 Presence of cardiac pacemaker: Secondary | ICD-10-CM

## 2020-05-19 DIAGNOSIS — I493 Ventricular premature depolarization: Secondary | ICD-10-CM

## 2020-05-19 NOTE — Patient Instructions (Signed)
Medication Instructions:  Your physician recommends that you continue on your current medications as directed. Please refer to the Current Medication list given to you today.  *If you need a refill on your cardiac medications before your next appointment, please call your pharmacy*   Lab Work: NONE If you have labs (blood work) drawn today and your tests are completely normal, you will receive your results only by: Marland Kitchen MyChart Message (if you have MyChart) OR . A paper copy in the mail If you have any lab test that is abnormal or we need to change your treatment, we will call you to review the results.   Testing/Procedures: NONE   Follow-Up: At [facility], you and your health needs are our priority.  As part of our continuing mission to provide you with exceptional heart care, we have created designated Provider Care Teams.  These Care Teams include your primary Cardiologist (physician) and Advanced Practice Providers (APPs -  Physician Assistants and Nurse Practitioners) who all work together to provide you with the care you need, when you need it.  We recommend signing up for the patient portal called "MyChart".  Sign up information is provided on this After Visit Summary.  MyChart is used to connect with patients for Virtual Visits (Telemedicine).  Patients are able to view lab/test results, encounter notes, upcoming appointments, etc.  Non-urgent messages can be sent to your provider as well.   To learn more about what you can do with MyChart, go to [email or web address].    Your next appointment:   6 month(s)  The format for your next appointment:   In Person  Provider:   Loman Brooklyn, MD

## 2020-07-29 ENCOUNTER — Ambulatory Visit (INDEPENDENT_AMBULATORY_CARE_PROVIDER_SITE_OTHER): Payer: Medicare Other

## 2020-07-29 DIAGNOSIS — I255 Ischemic cardiomyopathy: Secondary | ICD-10-CM

## 2020-07-30 LAB — CUP PACEART REMOTE DEVICE CHECK
Battery Remaining Percentage: 65 %
Brady Statistic RA Percent Paced: 54 %
Brady Statistic RV Percent Paced: 1 %
Date Time Interrogation Session: 20220517162850
Implantable Lead Implant Date: 20171205
Implantable Lead Implant Date: 20171205
Implantable Lead Location: 753859
Implantable Lead Location: 753860
Implantable Lead Model: 377
Implantable Lead Model: 377
Implantable Lead Serial Number: 49684101
Implantable Lead Serial Number: 49719338
Implantable Pulse Generator Implant Date: 20171205
Lead Channel Impedance Value: 468 Ohm
Lead Channel Impedance Value: 527 Ohm
Lead Channel Pacing Threshold Amplitude: 0.6 V
Lead Channel Pacing Threshold Amplitude: 1.1 V
Lead Channel Pacing Threshold Pulse Width: 0.4 ms
Lead Channel Pacing Threshold Pulse Width: 0.4 ms
Lead Channel Sensing Intrinsic Amplitude: 0.5 mV
Lead Channel Sensing Intrinsic Amplitude: 6.5 mV
Lead Channel Setting Pacing Amplitude: 2 V
Lead Channel Setting Pacing Amplitude: 2.4 V
Lead Channel Setting Pacing Pulse Width: 0.4 ms
Pulse Gen Model: 407145
Pulse Gen Serial Number: 68906976

## 2020-08-17 NOTE — Progress Notes (Signed)
Cardiology Office Note:    Date:  08/18/2020   ID:  Lindsey Murphy, DOB [birth year], MRN [id number]  PCP:  Everlean Cherry, MD  Cardiologist:  Norman Herrlich, MD    Referring MD: Everlean Cherry, MD please do labs in your office including a CMP and lipid profile.   ASSESSMENT:    1. Congenital anomaly of coronary artery   2. Cardiac microvascular disease   3. Sick sinus syndrome (HCC)   4. Pacemaker   5. PVC's (premature ventricular contractions)   6. Mixed hyperlipidemia   7. Chronic combined systolic and diastolic congestive heart failure (HCC)    PLAN:    In order of problems listed above:  1. Overall doing well asymptomatic from her coronary artery anomaly and microvascular disease New York Heart Association class I and continue medical therapy including her high intensity statin antiplatelet agent with clopidogrel beta-blocker and calcium channel blocker. 2. Stable sick sinus syndrome pacemaker followed in our device clinic 3. Stable PVCs suppressed with mexiletine continue the same 4. Continue her statin 5. Compensated continue her current loop diuretic last echo 2018 we will recheck if EF is less than 40% would benefit from optimizing medical therapy initiating Entresto   Next appointment: 6 months   Medication Adjustments/Labs and Tests Ordered: Current medicines are reviewed at length with the patient today.  Concerns regarding medicines are outlined above.  No orders of the defined types were placed in this encounter.  No orders of the defined types were placed in this encounter.   Chief Complaint  Patient presents with  . Follow-up    For CAD heart failure pacemaker frequent PVCs    History of Present Illness:    Lindsey Murphy is a 65 y.o. female with a hx of anomalous origin left circumflex coronary artery, myocardial infarction with nonobstructive coronary artery heart failure with mildly reduced ejection fraction 46% symptomatic bradycardia with  dual-chamber pacemaker hypertensive heart disease and frequent PVCs.  She was last seen 02/18/2020.  Most recent remote pacemaker check shows normal device parameters and function battery remaining percentage 65% she is RV paced 1% of the time.  She is on mexiletine to suppress PVCs with a burden of approximately 8%.  Compliance with diet, lifestyle and medications: Yes  She is pleased with the quality of her life. She is now caring for her mother with dementia in her home. She is not having edema shortness of breath chest pain palpitation or syncope. She is due for labs soon with her primary care physician Past Medical History:  Diagnosis Date  . Acute cystitis 06/25/2016  . Arthritis   . Asthma with hay fever, mild intermittent, uncomplicated 05/31/2017  . Bell's palsy 08/07/2019  . Bile reflux esophagitis 04/29/2015  . BMI 40.0-44.9, adult (HCC) 08/23/2016  . Cellulitis of lower limb 05/24/2017  . Chest pain 06/25/2016  . Chronic diastolic congestive heart failure (HCC) 07/06/2016  . 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 Overview:  LCFx, benign variant Formatting of this note might be different from the original. LCFx, benign variant  . Coronary artery disease of native artery of native heart with stable angina pectoris (HCC) 04/29/2015  . Drug therapy 04/27/2015  . Frequent PVCs 10/05/2015   Overview:  With normal LV EF% on a beta blocker  Formatting of this note might be different  from the original. With normal LV EF% on a beta blocker  . GERD (gastroesophageal reflux disease)   . Hay fever 11/24/2015  . Heart murmur   . High risk medication use 04/27/2017   Flecanide  . History of hip replacement 12/17/2014  . History of repair of hip joint 12/17/2014  . Hx of Bell's palsy   . Hyperlipidemia 04/27/2015  . Hypertension   . Hypertensive heart  disease with chronic systolic congestive heart failure (HCC) 07/22/2016  . IBS (irritable bowel syndrome) 04/29/2015  . Ischemic cardiomyopathy 09/15/2016   Cath with nonSTEMI 06/25/2016 Conclusions:  Normal coronary arteries. Anomalous dominant circ from R sinus Mild inferior segmental LV systolic dysfunction  CTA 07/21/16 ejection fraction 46%  Formatting of this note might be different from the original. Overview:  Cath with nonSTEMI 06/25/2016 Conclusions:  Normal coronary arteries. Anomalous dominant circ from R sinus Mild inferior segmental LV sys  . LFT elevation 02/28/2017  . LVH (left ventricular hypertrophy) 04/29/2015  . Migraine 04/29/2015  . Mild CAD 11/21/2014  . Myocardial infarction (HCC)    12/2012 - vasospasms   . NSTEMI (non-ST elevated myocardial infarction) (HCC) 06/25/2016  . Old MI (myocardial infarction) 04/27/2015   NSTEMI 2014 and recent NSTEMI 06/25/16      Formatting of this note might be different from the original. Overview:  NSTEMI 2014 and recent NSTEMI 06/25/16  . OSA on CPAP 04/29/2015  . Osteoarthritis 04/29/2015  . Pacemaker 02/18/2016   Dual chamber BIOTRONIC [place]       . Pacemaker lead failure 02/23/2016  . Pacemaker reprogramming/check 02/17/2016  . Pain in left knee 05/04/2017  . Pain in right foot 05/04/2017  . Peripheral vascular disease (HCC)   . Pink eye disease of left eye    diagnosed 12/06/2014   . Pneumonia    hx of pneumonia   . Preoperative cardiovascular examination 11/21/2014  . Psoriasis 05/18/2015  . Psoriatic arthritis (HCC) 04/29/2015  . S/P left THA, AA 12/17/2014  . Sciatica, right side 04/29/2015  . Seasonal allergies 08/07/2019  . Severe sinus bradycardia 09/15/2016  . Sick sinus syndrome (HCC) 02/18/2016  . Sinus bradycardia 01/01/2016  . Sleep apnea    cpap- does not know settings  . Thrombophlebitis of superficial veins of left lower extremity 02/26/2019  . Thyroid nodule 03/16/2017  . TSH elevation 09/08/2018  . Varicose veins of bilateral lower  extremities with pain 03/20/2019  . Venous insufficiency 04/29/2015  . Vitamin D deficiency 04/29/2015    Past Surgical History:  Procedure Laterality Date  . acterior cervical fusion     . left eye duct eye surgery     . right knee surgery     . TOTAL HIP ARTHROPLASTY Left 12/17/2014   Procedure: LEFT TOTAL HIP ARTHROPLASTY ANTERIOR APPROACH;  Surgeon: Durene Romans, MD;  Location: [facility];  Service: Orthopedics;  Laterality: Left;    Current Medications: Current Meds  Medication Sig  . Albuterol Sulfate (PROAIR RESPICLICK) 108 (90 Base) MCG/ACT AEPB Inhale 2 puffs into the lungs every 4 (four) hours as needed (Wheezing).  Marland Kitchen amLODipine (NORVASC) 2.5 MG tablet Take 1 tablet (2.5 mg total) by mouth daily.  Marland Kitchen atorvastatin (LIPITOR) 10 MG tablet Take 1 tablet (10 mg total) by mouth daily.  . Azelastine HCl 0.15 % SOLN Place 1 spray into the nose 2 (two) times daily.   . calcium-vitamin D (OSCAL WITH D) 500-200 MG-UNIT TABS tablet Take 1 tablet by mouth 2 (two) times daily.  . Cholecalciferol (  VITAMIN D PO) Take 5,000 Units by mouth daily.   . clopidogrel (PLAVIX) 75 MG tablet Take 1 tablet (75 mg total) by mouth daily.  . COSENTYX SENSOREADY 300 DOSE 150 MG/ML SOAJ Inject 300 mg into the skin every 28 (twenty-eight) days.   . fluticasone (FLONASE) 50 MCG/ACT nasal spray Place 2 sprays into both nostrils daily. Bedtime  . furosemide (LASIX) 20 MG tablet Take 20 mg by mouth 2 (two) times daily.   Marland Kitchen glucosamine-chondroitin 500-400 MG tablet Take 1 tablet by mouth 2 (two) times daily.   Marland Kitchen guaiFENesin (MUCINEX) 600 MG 12 hr tablet Take 600 mg by mouth at bedtime.   . halobetasol (ULTRAVATE) 0.05 % ointment Apply 1 application topically 2 (two) times daily as needed (Psoriasis . rash).  . loratadine (CLARITIN) 10 MG tablet Take 10 mg by mouth daily.  . metoprolol succinate (TOPROL-XL) 50 MG 24 hr tablet Take 1 tablet (50 mg total) by mouth 2 (two) times daily. Take with or immediately following a  meal.  . mexiletine (MEXITIL) 200 MG capsule Take 1 capsule (200 mg total) by mouth 2 (two) times daily.  . montelukast (SINGULAIR) 10 MG tablet Take 10 mg by mouth at bedtime.  . Multiple Vitamin (MULTIVITAMIN WITH MINERALS) TABS tablet Take 1 tablet by mouth daily.  . nitroGLYCERIN (NITROSTAT) 0.4 MG SL tablet Place 0.4 mg every 5 (five) minutes as needed under the tongue for chest pain.   . Omega-3 Fatty Acids (SALMON OIL-1000 PO) Take 1 tablet by mouth 2 (two) times daily.   . pantoprazole (PROTONIX) 40 MG tablet Take 40 mg by mouth daily.  . potassium chloride (K-DUR,KLOR-CON) 10 MEQ tablet Take 1 tablet (10 mEq total) by mouth daily.  . vitamin C (ASCORBIC ACID) 500 MG tablet Take 500 mg by mouth daily.  . Wheat Dextrin (BENEFIBER PO) Take 1 Scoop by mouth daily.     Allergies:   Penicillins, Polymyxin b-trimethoprim, Tape, Etanercept, Lovastatin, Meloxicam, and Polymyxin b   Social History   Socioeconomic History  . Marital status: Married    Spouse name: Not on file  . Number of children: Not on file  . Years of education: Not on file  . Highest education level: Not on file  Occupational History  . Not on file  Tobacco Use  . Smoking status: Never Smoker  . Smokeless tobacco: Never Used  Vaping Use  . Vaping Use: Never used  Substance and Sexual Activity  . Alcohol use: No  . Drug use: No  . Sexual activity: Not on file  Other Topics Concern  . Not on file  Social History Narrative  . Not on file   Social Determinants of Health   Financial Resource Strain: Not on file  Food Insecurity: Not on file  Transportation Needs: Not on file  Physical Activity: Not on file  Stress: Not on file  Social Connections: Not on file     Family History: The patient's family history includes Heart attack in her maternal grandmother; Heart block in her mother; Stroke in her mother; Valvular heart disease in her mother. ROS:   Please see the history of present illness.    All  other systems reviewed and are negative.  EKGs/Labs/Other Studies Reviewed:    The following studies were reviewed today:  Recent Labs: 02/18/2020: ALT 32; BUN 16; Creatinine, Ser 0.77; NT-Pro BNP 260; Potassium 4.5; Sodium 142  Recent Lipid Panel    Component Value Date/Time   CHOL 157 02/18/2020 1156  TRIG 128 02/18/2020 1156   HDL 56 02/18/2020 1156   CHOLHDL 2.8 02/18/2020 1156   LDLCALC 79 02/18/2020 1156    Physical Exam:    VS:  BP 110/70 (BP Location: Left Arm, Patient Position: Sitting, Cuff Size: Large)   Pulse 76   Ht 5\' 4"  (1.626 m)   Wt 242 lb 3.2 oz (109.9 kg)   SpO2 96%   BMI 41.57 kg/m     Wt Readings from Last 3 Encounters:  08/18/20 242 lb 3.2 oz (109.9 kg)  05/19/20 224 lb (101.6 kg)  02/18/20 239 lb (108.4 kg)    GEN:  Well nourished, well developed in no acute distress HEENT: Normal NECK: No JVD; No carotid bruits LYMPHATICS: No lymphadenopathy CARDIAC: RRR, no murmurs, rubs, gallops RESPIRATORY:  Clear to auscultation without rales, wheezing or rhonchi  ABDOMEN: Soft, non-tender, non-distended MUSCULOSKELETAL:  No edema; No deformity  SKIN: Warm and dry NEUROLOGIC:  Alert and oriented x 3 PSYCHIATRIC:  Normal affect    Signed, Norman Herrlich, MD  08/18/2020 11:23 AM    [facility]

## 2020-08-18 ENCOUNTER — Encounter: Payer: Self-pay | Admitting: Cardiology

## 2020-08-18 ENCOUNTER — Ambulatory Visit (INDEPENDENT_AMBULATORY_CARE_PROVIDER_SITE_OTHER): Payer: Medicare Other | Admitting: Cardiology

## 2020-08-18 ENCOUNTER — Other Ambulatory Visit: Payer: Self-pay

## 2020-08-18 VITALS — BP 110/70 | HR 76 | Ht 64.0 in | Wt 242.2 lb

## 2020-08-18 DIAGNOSIS — Z950 Presence of cardiac pacemaker: Secondary | ICD-10-CM

## 2020-08-18 DIAGNOSIS — Q245 Malformation of coronary vessels: Secondary | ICD-10-CM

## 2020-08-18 DIAGNOSIS — I493 Ventricular premature depolarization: Secondary | ICD-10-CM

## 2020-08-18 DIAGNOSIS — I495 Sick sinus syndrome: Secondary | ICD-10-CM | POA: Diagnosis not present

## 2020-08-18 DIAGNOSIS — I2589 Other forms of chronic ischemic heart disease: Secondary | ICD-10-CM

## 2020-08-18 DIAGNOSIS — I2585 Chronic coronary microvascular dysfunction: Secondary | ICD-10-CM

## 2020-08-18 DIAGNOSIS — E782 Mixed hyperlipidemia: Secondary | ICD-10-CM

## 2020-08-18 DIAGNOSIS — I5042 Chronic combined systolic (congestive) and diastolic (congestive) heart failure: Secondary | ICD-10-CM

## 2020-08-18 NOTE — Patient Instructions (Signed)

## 2020-08-20 NOTE — Progress Notes (Signed)
Remote pacemaker transmission.   

## 2020-09-09 ENCOUNTER — Other Ambulatory Visit: Payer: Self-pay

## 2020-09-09 ENCOUNTER — Telehealth: Payer: Self-pay

## 2020-09-09 ENCOUNTER — Ambulatory Visit (INDEPENDENT_AMBULATORY_CARE_PROVIDER_SITE_OTHER): Payer: Medicare Other

## 2020-09-09 DIAGNOSIS — I2589 Other forms of chronic ischemic heart disease: Secondary | ICD-10-CM

## 2020-09-09 DIAGNOSIS — Q245 Malformation of coronary vessels: Secondary | ICD-10-CM | POA: Diagnosis not present

## 2020-09-09 DIAGNOSIS — I495 Sick sinus syndrome: Secondary | ICD-10-CM

## 2020-09-09 DIAGNOSIS — E782 Mixed hyperlipidemia: Secondary | ICD-10-CM

## 2020-09-09 DIAGNOSIS — Z950 Presence of cardiac pacemaker: Secondary | ICD-10-CM

## 2020-09-09 DIAGNOSIS — I493 Ventricular premature depolarization: Secondary | ICD-10-CM

## 2020-09-09 DIAGNOSIS — I5042 Chronic combined systolic (congestive) and diastolic (congestive) heart failure: Secondary | ICD-10-CM

## 2020-09-09 LAB — ECHOCARDIOGRAM COMPLETE
Area-P 1/2: 3.26 cm2
Calc EF: 49.8 %
MV M vel: 4.95 m/s
MV Peak grad: 98 mmHg
P 1/2 time: 686 msec
Radius: 0.4 cm
S' Lateral: 4.4 cm
Single Plane A2C EF: 49.4 %
Single Plane A4C EF: 50.7 %

## 2020-09-09 NOTE — Progress Notes (Signed)
Complete echocardiogram performed.  Jimmy Damarie [place] RDCS, RVT  

## 2020-09-09 NOTE — Telephone Encounter (Signed)
-----   Message from Baldo Daub, MD sent at 09/09/2020  1:27 PM EDT ----- This is a stable result her previous ejection fraction 46% unchanged mildly reduced and I do not think we need to make any adjustment in her medications.  This is reassuring.

## 2020-09-09 NOTE — Telephone Encounter (Signed)
Spoke with patient regarding results and recommendation.  Patient verbalizes understanding and is agreeable to plan of care. Advised patient to call back with any issues or concerns.  

## 2020-10-28 ENCOUNTER — Ambulatory Visit (INDEPENDENT_AMBULATORY_CARE_PROVIDER_SITE_OTHER): Payer: Medicare Other

## 2020-10-28 DIAGNOSIS — I495 Sick sinus syndrome: Secondary | ICD-10-CM

## 2020-10-28 LAB — CUP PACEART REMOTE DEVICE CHECK
Date Time Interrogation Session: 20220816073812
Implantable Lead Implant Date: 20171205
Implantable Lead Implant Date: 20171205
Implantable Lead Location: 753859
Implantable Lead Location: 753860
Implantable Lead Model: 377
Implantable Lead Model: 377
Implantable Lead Serial Number: 49684101
Implantable Lead Serial Number: 49719338
Implantable Pulse Generator Implant Date: 20171205
Pulse Gen Model: 407145
Pulse Gen Serial Number: 68906976

## 2020-11-16 NOTE — Progress Notes (Signed)
Remote pacemaker transmission.   

## 2020-11-24 ENCOUNTER — Other Ambulatory Visit: Payer: Self-pay

## 2020-11-24 ENCOUNTER — Telehealth: Payer: Self-pay | Admitting: Cardiology

## 2020-11-24 ENCOUNTER — Ambulatory Visit (INDEPENDENT_AMBULATORY_CARE_PROVIDER_SITE_OTHER): Payer: Medicare Other | Admitting: Cardiology

## 2020-11-24 ENCOUNTER — Encounter: Payer: Self-pay | Admitting: Cardiology

## 2020-11-24 VITALS — BP 114/72 | HR 82 | Ht 64.0 in | Wt 236.8 lb

## 2020-11-24 DIAGNOSIS — I493 Ventricular premature depolarization: Secondary | ICD-10-CM | POA: Diagnosis not present

## 2020-11-24 NOTE — Telephone Encounter (Signed)
Pt advised to keep appt this morning. She is agreeable to plan and will see [place] soon. She appreciates the quick return call

## 2020-11-24 NOTE — Progress Notes (Signed)
Electrophysiology Office Note   Date:  11/24/2020   ID:  Doneisha Ivey, DOB 01-Jul-1955, MRN [id number]  PCP:  Everlean Cherry, MD  Cardiologist:  Dulce Sellar Primary Electrophysiologist:  Olamide Carattini Jorja Loa, MD    No chief complaint on file.    History of Present Illness: Lindsey Murphy is a 65 y.o. female who is being seen today for the evaluation of PVCs, systolic heart failure at the request of Everlean Cherry, MD. Presenting today for electrophysiology evaluation.   She has a history significant for diastolic heart failure, congenital coronary anomaly, hyperlipidemia, symptomatic bradycardia status post Biotronik pacemaker, and PVCs.  She had an 8% PVC burden on her last interrogation.  She is currently on mexiletine.  Today, denies symptoms of palpitations, chest pain, shortness of breath, orthopnea, PND, lower extremity edema, claudication, dizziness, presyncope, syncope, bleeding, or neurologic sequela. The patient is tolerating medications without difficulties.  Since being seen she has done well.  She has noted no further PVCs.  She is overall comfortable with her control.  She has no problems with her pacemaker.  Unfortunately, she developed a sinus infection approximately 1 week ago.  She continues to have a cough, but has felt better with antibiotics.  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 Overview:  LCFx, benign variant Formatting of this note might be  different from the original. LCFx, benign variant   Coronary artery disease of native artery of native heart with stable angina pectoris (HCC) 04/29/2015   Drug therapy 04/27/2015   Frequent PVCs 10/05/2015   Overview:  With normal LV EF% on a beta blocker  Formatting of this note might be different from the original. With normal LV EF% on a beta blocker   GERD (gastroesophageal reflux disease)    Hay fever 11/24/2015   Heart murmur    High risk medication use 04/27/2017   Flecanide   History of hip replacement 12/17/2014   History of repair of hip joint 12/17/2014   Hx of Bell's palsy    Hyperlipidemia 04/27/2015   Hypertension    Hypertensive heart disease with chronic systolic congestive heart failure (HCC) 07/22/2016   IBS (irritable bowel syndrome) 04/29/2015   Ischemic cardiomyopathy 09/15/2016   Cath with nonSTEMI 06/25/2016 Conclusions:  Normal coronary arteries. Anomalous dominant circ from R sinus Mild inferior segmental LV systolic dysfunction  CTA 07/21/16 ejection fraction 46%  Formatting of this note might be different from the original. Overview:  Cath with nonSTEMI 06/25/2016 Conclusions:  Normal coronary arteries. Anomalous dominant circ from R sinus Mild inferior segmental LV sys   LFT elevation 02/28/2017   LVH (left ventricular hypertrophy) 04/29/2015   Migraine 04/29/2015   Mild CAD 11/21/2014   Myocardial infarction [facility])    12/2012 - vasospasms    NSTEMI (non-ST elevated myocardial infarction) (HCC) 06/25/2016   Old MI (myocardial infarction) 04/27/2015   NSTEMI 2014 and recent NSTEMI 06/25/16      Formatting of this  note might be different from the original. Overview:  NSTEMI 2014 and recent NSTEMI 06/25/16   OSA on CPAP 04/29/2015   Osteoarthritis 04/29/2015   Pacemaker 02/18/2016   Dual chamber BIOTRONIC [place]        Pacemaker lead failure 02/23/2016   Pacemaker reprogramming/check 02/17/2016   Pain in left knee 05/04/2017   Pain in right foot 05/04/2017   Peripheral vascular  disease (HCC)    Pink eye disease of left eye    diagnosed 12/06/2014    Pneumonia    hx of pneumonia    Preoperative cardiovascular examination 11/21/2014   Psoriasis 05/18/2015   Psoriatic arthritis (HCC) 04/29/2015   S/P left THA, AA 12/17/2014   Sciatica, right side 04/29/2015   Seasonal allergies 08/07/2019   Severe sinus bradycardia 09/15/2016   Sick sinus syndrome (HCC) 02/18/2016   Sinus bradycardia 01/01/2016   Sleep apnea    cpap- does not know settings   Thrombophlebitis of superficial veins of left lower extremity 02/26/2019   Thyroid nodule 03/16/2017   TSH elevation 09/08/2018   Varicose veins of bilateral lower extremities with pain 03/20/2019   Venous insufficiency 04/29/2015   Vitamin D deficiency 04/29/2015   Past Surgical History:  Procedure Laterality Date   acterior cervical fusion      left eye duct eye surgery      right knee surgery      TOTAL HIP ARTHROPLASTY Left 12/17/2014   Procedure: LEFT TOTAL HIP ARTHROPLASTY ANTERIOR APPROACH;  Surgeon: Durene Romans, MD;  Location: [facility];  Service: Orthopedics;  Laterality: Left;     Current Outpatient Medications  Medication Sig Dispense Refill   Albuterol Sulfate (PROAIR RESPICLICK) 108 (90 Base) MCG/ACT AEPB Inhale 2 puffs into the lungs every 4 (four) hours as needed (Wheezing).     amLODipine (NORVASC) 2.5 MG tablet Take 1 tablet (2.5 mg total) by mouth daily. 90 tablet 3   atorvastatin (LIPITOR) 10 MG tablet Take 1 tablet (10 mg total) by mouth daily. 90 tablet 3   Azelastine HCl 0.15 % SOLN Place 1 spray into the nose 2 (two) times daily.      calcium-vitamin D (OSCAL WITH D) 500-200 MG-UNIT TABS tablet Take 1 tablet by mouth 2 (two) times daily.     Cholecalciferol (VITAMIN D PO) Take 5,000 Units by mouth daily.      clopidogrel (PLAVIX) 75 MG tablet Take 1 tablet (75 mg total) by mouth daily. 90 tablet 3   COSENTYX SENSOREADY 300 DOSE 150 MG/ML SOAJ Inject 300 mg into the skin every 28 (twenty-eight) days.       fluticasone (FLONASE) 50 MCG/ACT nasal spray Place 2 sprays into both nostrils daily. Bedtime     furosemide (LASIX) 20 MG tablet Take 20 mg by mouth 2 (two) times daily.      glucosamine-chondroitin 500-400 MG tablet Take 1 tablet by mouth 2 (two) times daily.      guaiFENesin (MUCINEX) 600 MG 12 hr tablet Take 600 mg by mouth at bedtime.      halobetasol (ULTRAVATE) 0.05 % ointment Apply 1 application topically 2 (two) times daily as needed (Psoriasis . rash).     loratadine (CLARITIN) 10 MG tablet Take 10 mg by mouth daily.     metoprolol succinate (TOPROL-XL) 50 MG 24 hr tablet Take 1 tablet (50 mg total) by mouth 2 (two) times daily. Take with or immediately following a meal. 180 tablet 1   mexiletine (MEXITIL) 200 MG capsule Take 1 capsule (200 mg  total) by mouth 2 (two) times daily. 180 capsule 3   montelukast (SINGULAIR) 10 MG tablet Take 10 mg by mouth at bedtime.     Multiple Vitamin (MULTIVITAMIN WITH MINERALS) TABS tablet Take 1 tablet by mouth daily.     nitroGLYCERIN (NITROSTAT) 0.4 MG SL tablet Place 0.4 mg every 5 (five) minutes as needed under the tongue for chest pain.      Omega-3 Fatty Acids (SALMON OIL-1000 PO) Take 1 tablet by mouth 2 (two) times daily.      pantoprazole (PROTONIX) 40 MG tablet Take 40 mg by mouth daily.     potassium chloride (K-DUR,KLOR-CON) 10 MEQ tablet Take 1 tablet (10 mEq total) by mouth daily. 90 tablet 0   vitamin C (ASCORBIC ACID) 500 MG tablet Take 500 mg by mouth daily.     Wheat Dextrin (BENEFIBER PO) Take 1 Scoop by mouth daily.     No current facility-administered medications for this visit.    Allergies:   Penicillins, Polymyxin b-trimethoprim, Tape, Etanercept, Lovastatin, Meloxicam, and Polymyxin b   Social History:  The patient  reports that she has never smoked. She has never used smokeless tobacco. She reports that she does not drink alcohol and does not use drugs.   Family History:  The patient's family history includes Heart attack  in her maternal grandmother; Heart block in her mother; Stroke in her mother; Valvular heart disease in her mother.   ROS:  Please see the history of present illness.   Otherwise, review of systems is positive for none.   All other systems are reviewed and negative.   PHYSICAL EXAM: VS:  BP 114/72   Pulse 82   Ht 5\' 4"  (1.626 m)   Wt 236 lb 12.8 oz (107.4 kg)   SpO2 97%   BMI 40.65 kg/m  , BMI Body mass index is 40.65 kg/m. GEN: Well nourished, well developed, in no acute distress  HEENT: normal  Neck: no JVD, carotid bruits, or masses Cardiac: RRR; no murmurs, rubs, or gallops,no edema  Respiratory:  clear to auscultation bilaterally, normal work of breathing GI: soft, nontender, nondistended, + BS MS: no deformity or atrophy  Skin: warm and dry, device site well healed Neuro:  Strength and sensation are intact Psych: euthymic mood, full affect  EKG:  EKG is ordered today. Personal review of the ekg ordered shows atrial paced, rate 82  Personal review of the device interrogation today. Results in Paceart   Recent Labs: 02/18/2020: ALT 32; BUN 16; Creatinine, Ser 0.77; NT-Pro BNP 260; Potassium 4.5; Sodium 142    Lipid Panel     Component Value Date/Time   CHOL 157 02/18/2020 1156   TRIG 128 02/18/2020 1156   HDL 56 02/18/2020 1156   CHOLHDL 2.8 02/18/2020 1156   LDLCALC 79 02/18/2020 1156     Wt Readings from Last 3 Encounters:  11/24/20 236 lb 12.8 oz (107.4 kg)  08/18/20 242 lb 3.2 oz (109.9 kg)  05/19/20 224 lb (101.6 kg)      Other studies Reviewed: Additional studies/ records that were reviewed today include: TTE  04/05/16 Review of the above records today demonstrates:  LV cavity mild to moderately dilated.  Ejection fraction 45-50% Mildly dilated left atrium Trace aortic regurgitation Structurally normal mitral valve  ETT 02/02/17 Blood pressure demonstrated a hypertensive response to exercise. There was no ST segment deviation noted during stress.    Normal ETT No ischemia HTN response to exercise   ASSESSMENT AND PLAN:  1.  PVCs: Currently on mexiletine 200 mg twice a day.  High risk medication monitoring with ECG.  PVCs have been multifocal.  There are no PVCs on her ECG today.  She also has a low burden but on her pacemaker interrogation.  She currently feels well.  We Jeniffer Culliver continue with current management.  2.  Mild systolic and diastolic heart failure: No signs of obvious volume overload.  3.  Hyperlipidemia: Continue atorvastatin 10 mg for primary cardiology  4.  Congenital anomaly of the coronary artery: Currently on Plavix 75 mg daily, Toprol-XL 50 mg daily, atorvastatin.  No current chest pain.  No changes.  5.  Symptomatic bradycardia: Status post Biotronik dual-chamber pacemaker.  Device functioning appropriately.  No changes at this time.  Current medicines are reviewed at length with the patient today.   The patient does not have concerns regarding her medicines.  The following changes were made today: None  Labs/ tests ordered today include:  Orders Placed This Encounter  Procedures   EKG 12-Lead      Disposition:   FU with Juana Haralson 12 months  Signed, Tedric Leeth Jorja Loa, MD  11/24/2020 10:59 AM     [facility] [address] [place] [address] [phone] (office) [phone] (fax)

## 2020-11-24 NOTE — Patient Instructions (Signed)
Medication Instructions:  Your physician recommends that you continue on your current medications as directed. Please refer to the Current Medication list given to you today.  *If you need a refill on your cardiac medications before your next appointment, please call your pharmacy*   Lab Work: None ordered If you have labs (blood work) drawn today and your tests are completely normal, you will receive your results only by: MyChart Message (if you have MyChart) OR A paper copy in the mail If you have any lab test that is abnormal or we need to change your treatment, we will call you to review the results.   Testing/Procedures: None ordered   Follow-Up: At [facility], you and your health needs are our priority.  As part of our continuing mission to provide you with exceptional heart care, we have created designated Provider Care Teams.  These Care Teams include your primary Cardiologist (physician) and Advanced Practice Providers (APPs -  Physician Assistants and Nurse Practitioners) who all work together to provide you with the care you need, when you need it.  We recommend signing up for the patient portal called "MyChart".  Sign up information is provided on this After Visit Summary.  MyChart is used to connect with patients for Virtual Visits (Telemedicine).  Patients are able to view lab/test results, encounter notes, upcoming appointments, etc.  Non-urgent messages can be sent to your provider as well.   To learn more about what you can do with MyChart, go to [email or web address].    Remote monitoring is used to monitor your Pacemaker or ICD from home. This monitoring reduces the number of office visits required to check your device to one time per year. It allows [place] to keep an eye on the functioning of your device to ensure it is working properly. You are scheduled for a device check from home on 01/27/2021. You may send your transmission at any time that day. If you have a  wireless device, the transmission will be sent automatically. After your physician reviews your transmission, you will receive a postcard with your next transmission date.  Your next appointment:   1 year(s)  The format for your next appointment:   In Person  Provider:   Loman Brooklyn, MD   Thank you for choosing [facility]!!   Dory Horn, RN [phone]    Other Instructions

## 2020-11-24 NOTE — Telephone Encounter (Signed)
Pt was recently diagnosed with a Bacterial Sinus Infection, she completed her antibiotics Friday but she still have a bad cough. Pt would like to know from medical personal if she should still come to her appt this morning. Please advise pt further

## 2021-01-27 ENCOUNTER — Ambulatory Visit (INDEPENDENT_AMBULATORY_CARE_PROVIDER_SITE_OTHER): Payer: Medicare Other

## 2021-01-27 DIAGNOSIS — I495 Sick sinus syndrome: Secondary | ICD-10-CM

## 2021-01-27 LAB — CUP PACEART REMOTE DEVICE CHECK
Date Time Interrogation Session: 20221115083032
Implantable Lead Implant Date: 20171205
Implantable Lead Implant Date: 20171205
Implantable Lead Location: 753859
Implantable Lead Location: 753860
Implantable Lead Model: 377
Implantable Lead Model: 377
Implantable Lead Serial Number: 49684101
Implantable Lead Serial Number: 49719338
Implantable Pulse Generator Implant Date: 20171205
Pulse Gen Model: 407145
Pulse Gen Serial Number: 68906976

## 2021-02-04 NOTE — Progress Notes (Signed)
Remote pacemaker transmission.   

## 2021-03-11 DIAGNOSIS — Z6839 Body mass index (BMI) 39.0-39.9, adult: Secondary | ICD-10-CM | POA: Insufficient documentation

## 2021-03-11 DIAGNOSIS — Z7902 Long term (current) use of antithrombotics/antiplatelets: Secondary | ICD-10-CM | POA: Insufficient documentation

## 2021-03-11 DIAGNOSIS — E66812 Obesity, class 2: Secondary | ICD-10-CM

## 2021-03-11 HISTORY — DX: Morbid (severe) obesity due to excess calories: E66.01

## 2021-03-11 HISTORY — DX: Long term (current) use of antithrombotics/antiplatelets: Z79.02

## 2021-03-11 HISTORY — DX: Obesity, class 2: E66.812

## 2021-03-18 ENCOUNTER — Telehealth: Payer: Self-pay | Admitting: Cardiology

## 2021-03-18 NOTE — Telephone Encounter (Signed)
Pt c/o medication issue:  1. Name of Medication: clopidogrel (PLAVIX) 75 MG tablet  2. How are you currently taking this medication (dosage and times per day)? AS DIRECTED  3. Are you having a reaction (difficulty breathing--STAT)?   4. What is your medication issue? PT HAS BEEN EXPERIENCING BAD NOSE BLEEDS FOR ABOUT 2 WEEKS. SHE GOES TO SEE ENT PROVIDER Friday, PT WANTS TO KNOW IF SHE CAN CUT BACK ON THIS MEDICINE UNTIL AFTER SHE SEE'S THE ENT PROVIDER. PT SAID 1 NOSE BLEED CAN BLEED FOR ABOUT AN HOUR

## 2021-03-18 NOTE — Telephone Encounter (Signed)
PT HAS BEEN EXPERIENCING BAD NOSE BLEEDS FOR ABOUT 2 WEEKS. SHE GOES TO SEE ENT PROVIDER Friday, PT WANTS TO KNOW IF SHE CAN CUT BACK ON THIS MEDICINE UNTIL AFTER SHE SEE'S THE ENT PROVIDER. PT SAID 1 NOSE BLEED CAN BLEED FOR ABOUT AN HOUR. She is referring to plavix.

## 2021-03-19 NOTE — Telephone Encounter (Signed)
Spoke to the patient just now and let her know Dr. Munley's recommendations. She verbalizes understanding.  

## 2021-03-19 NOTE — Telephone Encounter (Signed)
Left message on patients voicemail to please return our call.   

## 2021-03-19 NOTE — Telephone Encounter (Signed)
Pt is f/u on how long she should be off of Plavix, she was advised by the ED Provider to stop the Plavix for 2 days. Pt wants to know if this is correct

## 2021-03-20 DIAGNOSIS — J342 Deviated nasal septum: Secondary | ICD-10-CM | POA: Insufficient documentation

## 2021-03-20 DIAGNOSIS — R040 Epistaxis: Secondary | ICD-10-CM | POA: Insufficient documentation

## 2021-03-20 DIAGNOSIS — H6983 Other specified disorders of Eustachian tube, bilateral: Secondary | ICD-10-CM | POA: Insufficient documentation

## 2021-03-20 DIAGNOSIS — H6993 Unspecified Eustachian tube disorder, bilateral: Secondary | ICD-10-CM

## 2021-03-20 HISTORY — DX: Epistaxis: R04.0

## 2021-03-20 HISTORY — DX: Deviated nasal septum: J34.2

## 2021-03-20 HISTORY — DX: Unspecified eustachian tube disorder, bilateral: H69.93

## 2021-03-24 ENCOUNTER — Encounter: Payer: Self-pay | Admitting: Cardiology

## 2021-03-24 ENCOUNTER — Other Ambulatory Visit: Payer: Self-pay

## 2021-03-24 ENCOUNTER — Ambulatory Visit (INDEPENDENT_AMBULATORY_CARE_PROVIDER_SITE_OTHER): Payer: Medicare Other | Admitting: Cardiology

## 2021-03-24 VITALS — BP 112/72 | HR 74 | Ht 64.0 in | Wt 234.6 lb

## 2021-03-24 DIAGNOSIS — Z950 Presence of cardiac pacemaker: Secondary | ICD-10-CM | POA: Diagnosis not present

## 2021-03-24 DIAGNOSIS — I493 Ventricular premature depolarization: Secondary | ICD-10-CM

## 2021-03-24 DIAGNOSIS — R040 Epistaxis: Secondary | ICD-10-CM

## 2021-03-24 DIAGNOSIS — I25118 Atherosclerotic heart disease of native coronary artery with other forms of angina pectoris: Secondary | ICD-10-CM | POA: Diagnosis not present

## 2021-03-24 DIAGNOSIS — I495 Sick sinus syndrome: Secondary | ICD-10-CM | POA: Diagnosis not present

## 2021-03-24 DIAGNOSIS — E782 Mixed hyperlipidemia: Secondary | ICD-10-CM

## 2021-03-24 DIAGNOSIS — I5042 Chronic combined systolic (congestive) and diastolic (congestive) heart failure: Secondary | ICD-10-CM

## 2021-03-24 MED ORDER — ASPIRIN EC 81 MG PO TBEC: 81.0000 mg | DELAYED_RELEASE_TABLET | Freq: Every day | ORAL | 3 refills | Status: AC

## 2021-03-24 NOTE — Patient Instructions (Signed)
Medication Instructions:  Your physician has recommended you make the following change in your medication:   STOP: Plavix START: Aspirin 81 mg on 04/08/21.  *If you need a refill on your cardiac medications before your next appointment, please call your pharmacy*   Lab Work: Your physician recommends that you return for lab work in: Labs today: CMP,Lipids If you have labs (blood work) drawn today and your tests are completely normal, you will receive your results only by: MyChart Message (if you have MyChart) OR A paper copy in the mail If you have any lab test that is abnormal or we need to change your treatment, we will call you to review the results.   Testing/Procedures:    Follow-Up: At [facility], you and your health needs are our priority.  As part of our continuing mission to provide you with exceptional heart care, we have created designated Provider Care Teams.  These Care Teams include your primary Cardiologist (physician) and Advanced Practice Providers (APPs -  Physician Assistants and Nurse Practitioners) who all work together to provide you with the care you need, when you need it.  We recommend signing up for the patient portal called "MyChart".  Sign up information is provided on this After Visit Summary.  MyChart is used to connect with patients for Virtual Visits (Telemedicine).  Patients are able to view lab/test results, encounter notes, upcoming appointments, etc.  Non-urgent messages can be sent to your provider as well.   To learn more about what you can do with MyChart, go to [email or web address].    Your next appointment:   6 month(s)  The format for your next appointment:   In Person  Provider:   Norman Herrlich, MD    Other Instructions

## 2021-03-24 NOTE — Progress Notes (Signed)
Symptom Cardiology Office Note:    Date:  03/24/2021   ID:  Lindsey Murphy, DOB March 28, 1955, MRN [id number]  PCP:  Everlean Cherry, MD  Cardiologist:  Norman Herrlich, MD    Referring MD: Everlean Cherry, MD    ASSESSMENT:    1. Coronary artery disease of native artery of native heart with stable angina pectoris (HCC)   2. Chronic combined systolic and diastolic congestive heart failure (HCC)   3. Sick sinus syndrome (HCC)   4. Pacemaker   5. PVC's (premature ventricular contractions)   6. Mixed hyperlipidemia   7. Epistaxis    PLAN:    In order of problems listed above:  Overall Lindsey Murphy is doing well with her CAD and chronic microvascular dysfunction is having no angina continue medical treatment dropping clopidogrel starting aspirin in [redacted] weeks along with her beta-blocker and statin.  At this time I do not think she requires repeat ischemia evaluation. Stable sick sinus syndrome pacemaker follow-up Device clinic Stable PVCs continue mexiletine she is also followed by EP On high intensity statin check liver function lipid profile Improved I would not put her back on clopidogrel escalate to 4 weeks and place her on low-dose aspirin.  I appreciate the ENT involvement   Next appointment: 6 months   Medication Adjustments/Labs and Tests Ordered: Current medicines are reviewed at length with the patient today.  Concerns regarding medicines are outlined above.  Orders Placed This Encounter  Procedures   Comprehensive Metabolic Panel (CMET)   Lipid Profile   Meds ordered this encounter  Medications   aspirin EC 81 MG tablet    Sig: Take 1 tablet (81 mg total) by mouth daily. Swallow whole.    Dispense:  90 tablet    Refill:  3    Chief Complaint  Patient presents with   Follow-up    She is having severe epistasis required a packing in the emergency room off clopidogrel and pack removed by ENT yesterday she remains off clopidogrel    History of Present Illness:     Lindsey Murphy is a 66 y.o. 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  She has been evaluated by CT surgery [facility] for anomalous coronary artery felt to be benign.  Not advised surgical revascularization.  Compliance with diet, lifestyle and medications: Yes  Last week after allergies I fielded a MyChart message patient is having severe nosebleed and I directed her to the ED she had a nasal pack and stop clopidogrel seen ENT in the pack was removed 2 days ago we decided to remain off clopidogrel in 2 weeks and now go on low-dose aspirin. Otherwise is doing well no palpitations syncope edema shortness of breath or chest pain. She tolerates her statin without muscle pain or weakness  Last device download 01/27/2021 showing normal device and lead parameters.  She had an echocardiogram 09/09/2020 left ventricular ejection fraction remains mildly reduced 45 to 50% with grade 2 diastolic dysfunction.  The right ventricle is normal in size function and pulmonary artery pressure.  There is moderate mitral regurgitation functional.  1. Left ventricular ejection fraction, by estimation, is 45 to 50%. The  left ventricle has mildly decreased function. The left ventricle has no  regional wall motion abnormalities. The left ventricular internal cavity  size was moderately dilated. Left  ventricular diastolic parameters are consistent with Grade II diastolic  dysfunction (pseudonormalization). The average left ventricular global  longitudinal strain is -13.1 %. The global longitudinal strain is  abnormal.   2. Right ventricular systolic function is normal. The right ventricular  size is normal. There is normal pulmonary  artery systolic pressure.   3. Moderate eccentric posterioly directed mitral valve regurgitation. No  evidence of mitral stenosis.   4. The aortic valve is normal in structure. Aortic valve regurgitation is  not visualized. No aortic stenosis is present.   5. The inferior vena cava is normal in size with greater than 50%  respiratory variability, suggesting right atrial pressure of 3 mmHg.  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 Overview:  LCFx, benign variant Formatting of this note might be different from the original. LCFx, benign variant   Coronary artery disease of native artery of native heart with stable angina pectoris (HCC) 04/29/2015   Drug therapy 04/27/2015   Frequent PVCs 10/05/2015   Overview:  With normal LV EF% on a beta blocker  Formatting of this note might be different from the original. With normal LV EF% on a beta blocker   GERD (gastroesophageal reflux disease)    Hay fever 11/24/2015   Heart murmur    High risk medication use 04/27/2017   Flecanide   History of hip replacement 12/17/2014   History of repair of hip joint 12/17/2014   Hx of Bell's palsy    Hyperlipidemia 04/27/2015   Hypertension    Hypertensive heart disease with chronic systolic congestive heart failure (HCC) 07/22/2016   IBS (irritable bowel syndrome) 04/29/2015   Ischemic cardiomyopathy 09/15/2016   Cath with nonSTEMI 06/25/2016 Conclusions:  Normal coronary arteries. Anomalous dominant  circ from R sinus Mild inferior segmental LV systolic dysfunction  CTA 07/21/16 ejection fraction 46%  Formatting of this note might be different from the original. Overview:  Cath with nonSTEMI 06/25/2016 Conclusions:  Normal coronary arteries. Anomalous dominant circ from R sinus Mild inferior segmental LV sys   LFT elevation 02/28/2017   LVH (left ventricular hypertrophy) 04/29/2015   Migraine 04/29/2015   Mild CAD 11/21/2014   Myocardial infarction [facility])    12/2012 - vasospasms    NSTEMI (non-ST elevated myocardial infarction) (HCC) 06/25/2016   Old MI (myocardial infarction) 04/27/2015   NSTEMI 2014 and recent NSTEMI 06/25/16      Formatting of this note might be different from the original. Overview:  NSTEMI 2014 and recent NSTEMI 06/25/16   OSA on CPAP 04/29/2015   Osteoarthritis 04/29/2015   Pacemaker 02/18/2016   Dual chamber BIOTRONIC [place]        Pacemaker lead failure 02/23/2016   Pacemaker reprogramming/check 02/17/2016   Pain in left knee 05/04/2017   Pain in right foot 05/04/2017   Peripheral vascular disease (HCC)    Pink eye disease of left eye    diagnosed 12/06/2014  Pneumonia    hx of pneumonia    Preoperative cardiovascular examination 11/21/2014   Psoriasis 05/18/2015   Psoriatic arthritis (HCC) 04/29/2015   S/P left THA, AA 12/17/2014   Sciatica, right side 04/29/2015   Seasonal allergies 08/07/2019   Severe sinus bradycardia 09/15/2016   Sick sinus syndrome (HCC) 02/18/2016   Sinus bradycardia 01/01/2016   Sleep apnea    cpap- does not know settings   Thrombophlebitis of superficial veins of left lower extremity 02/26/2019   Thyroid nodule 03/16/2017   TSH elevation 09/08/2018   Varicose veins of bilateral lower extremities with pain 03/20/2019   Venous insufficiency 04/29/2015   Vitamin D deficiency 04/29/2015    Past Surgical History:  Procedure Laterality Date   acterior cervical fusion      left eye duct eye surgery      right knee surgery      TOTAL HIP ARTHROPLASTY Left  12/17/2014   Procedure: LEFT TOTAL HIP ARTHROPLASTY ANTERIOR APPROACH;  Surgeon: Durene Romans, MD;  Location: [facility];  Service: Orthopedics;  Laterality: Left;    Current Medications: Current Meds  Medication Sig   aspirin EC 81 MG tablet Take 1 tablet (81 mg total) by mouth daily. Swallow whole.     Allergies:   Penicillins, Polymyxin b-trimethoprim, Tape, Etanercept, Lovastatin, Meloxicam, and Polymyxin b   Social History   Socioeconomic History   Marital status: Widowed    Spouse name: Not on file   Number of children: Not on file   Years of education: Not on file   Highest education level: Not on file  Occupational History   Not on file  Tobacco Use   Smoking status: Never   Smokeless tobacco: Never  Vaping Use   Vaping Use: Never used  Substance and Sexual Activity   Alcohol use: No   Drug use: No   Sexual activity: Not on file  Other Topics Concern   Not on file  Social History Narrative   Not on file   Social Determinants of Health   Financial Resource Strain: Not on file  Food Insecurity: Not on file  Transportation Needs: Not on file  Physical Activity: Not on file  Stress: Not on file  Social Connections: Not on file     Family History: The patient's family history includes Heart attack in her maternal grandmother; Heart block in her mother; Stroke in her mother; Valvular heart disease in her mother. ROS:   Please see the history of present illness.    All other systems reviewed and are negative.  EKGs/Labs/Other Studies Reviewed:    The following studies were reviewed today:    Recent Labs: No results found for requested labs within last 8760 hours.  Recent Lipid Panel    Component Value Date/Time   CHOL 157 02/18/2020 1156   TRIG 128 02/18/2020 1156   HDL 56 02/18/2020 1156   CHOLHDL 2.8 02/18/2020 1156   LDLCALC 79 02/18/2020 1156    Physical Exam:    VS:  BP 112/72 (BP Location: Right Arm, Patient Position: Sitting, Cuff Size:  Normal)    Pulse 74    Ht  (1.626 m)    Wt 234 lb 9.6 oz (106.4 kg)    SpO2 98%    BMI 40.27 kg/m     Wt Readings from Last 3 Encounters:  03/24/21 234 lb 9.6 oz (106.4 kg)  11/24/20 236 lb 12.8 oz (107.4 kg)  08/18/20 242 lb 3.2 oz (109.9 kg)  GEN: She appears her age well nourished, well developed in no acute distress HEENT: Normal NECK: No JVD; No carotid bruits LYMPHATICS: No lymphadenopathy CARDIAC: RRR, no murmurs, rubs, gallops RESPIRATORY:  Clear to auscultation without rales, wheezing or rhonchi  ABDOMEN: Soft, non-tender, non-distended MUSCULOSKELETAL:  No edema; No deformity  SKIN: Warm and dry NEUROLOGIC:  Alert and oriented x 3 PSYCHIATRIC:  Normal affect    Signed, Norman Herrlich, MD  03/24/2021 11:44 AM    [facility]

## 2021-03-25 ENCOUNTER — Telehealth: Payer: Self-pay

## 2021-03-25 LAB — LIPID PANEL
Chol/HDL Ratio: 3.1 ratio (ref 0.0–4.4)
Cholesterol, Total: 136 mg/dL (ref 100–199)
HDL: 44 mg/dL (ref >39)
LDL Chol Calc (NIH): 68 mg/dL (ref 0–99)
Triglycerides: 137 mg/dL (ref 0–149)
VLDL Cholesterol Cal: 24 mg/dL (ref 5–40)

## 2021-03-25 LAB — COMPREHENSIVE METABOLIC PANEL
ALT: 31 IU/L (ref 0–32)
AST: 39 IU/L (ref 0–40)
Albumin/Globulin Ratio: 2.2 (ref 1.2–2.2)
Albumin: 4.3 g/dL (ref 3.8–4.8)
Alkaline Phosphatase: 100 IU/L (ref 44–121)
BUN/Creatinine Ratio: 17 (ref 12–28)
BUN: 14 mg/dL (ref 8–27)
Bilirubin Total: 0.4 mg/dL (ref 0.0–1.2)
CO2: 28 mmol/L (ref 20–29)
Calcium: 9.4 mg/dL (ref 8.7–10.3)
Chloride: 102 mmol/L (ref 96–106)
Creatinine, Ser: 0.82 mg/dL (ref 0.57–1.00)
Globulin, Total: 2 g/dL (ref 1.5–4.5)
Glucose: 125 mg/dL — ABNORMAL HIGH (ref 70–99)
Potassium: 3.9 mmol/L (ref 3.5–5.2)
Sodium: 141 mmol/L (ref 134–144)
Total Protein: 6.3 g/dL (ref 6.0–8.5)
eGFR: 80 mL/min/{1.73_m2} (ref >59)

## 2021-03-26 ENCOUNTER — Telehealth: Payer: Self-pay

## 2021-03-26 NOTE — Telephone Encounter (Signed)
Spoke with patient regarding results and recommendation.  Patient verbalizes understanding and is agreeable to plan of care. Advised patient to call back with any issues or concerns.  

## 2021-03-26 NOTE — Telephone Encounter (Signed)
-----   Message from Richardo Priest, MD sent at 03/25/2021 12:37 PM EST ----- Normal or stable result  Great result no changes

## 2021-04-28 ENCOUNTER — Ambulatory Visit (INDEPENDENT_AMBULATORY_CARE_PROVIDER_SITE_OTHER): Payer: Medicare Other

## 2021-04-28 DIAGNOSIS — I495 Sick sinus syndrome: Secondary | ICD-10-CM | POA: Diagnosis not present

## 2021-04-29 LAB — CUP PACEART REMOTE DEVICE CHECK
Date Time Interrogation Session: 20230215074958
Implantable Lead Implant Date: 20171205
Implantable Lead Implant Date: 20171205
Implantable Lead Location: 753859
Implantable Lead Location: 753860
Implantable Lead Model: 377
Implantable Lead Model: 377
Implantable Lead Serial Number: 49684101
Implantable Lead Serial Number: 49719338
Implantable Pulse Generator Implant Date: 20171205
Pulse Gen Model: 407145
Pulse Gen Serial Number: 68906976

## 2021-05-01 NOTE — Progress Notes (Signed)
Remote pacemaker transmission.   

## 2021-06-17 DIAGNOSIS — R040 Epistaxis: Secondary | ICD-10-CM | POA: Diagnosis not present

## 2021-06-17 DIAGNOSIS — H918X3 Other specified hearing loss, bilateral: Secondary | ICD-10-CM

## 2021-06-17 DIAGNOSIS — H918X9 Other specified hearing loss, unspecified ear: Secondary | ICD-10-CM | POA: Diagnosis not present

## 2021-06-17 HISTORY — DX: Other specified hearing loss, bilateral: H91.8X3

## 2021-06-26 ENCOUNTER — Other Ambulatory Visit: Payer: Self-pay | Admitting: Cardiology

## 2021-06-26 DIAGNOSIS — G4733 Obstructive sleep apnea (adult) (pediatric): Secondary | ICD-10-CM | POA: Diagnosis not present

## 2021-07-07 ENCOUNTER — Encounter: Payer: Self-pay | Admitting: Cardiology

## 2021-07-07 NOTE — Telephone Encounter (Signed)
error 

## 2021-07-16 ENCOUNTER — Other Ambulatory Visit: Payer: Self-pay | Admitting: Otolaryngology

## 2021-07-16 DIAGNOSIS — H918X9 Other specified hearing loss, unspecified ear: Secondary | ICD-10-CM

## 2021-07-28 ENCOUNTER — Ambulatory Visit (INDEPENDENT_AMBULATORY_CARE_PROVIDER_SITE_OTHER): Payer: Medicare Other

## 2021-07-28 ENCOUNTER — Other Ambulatory Visit (HOSPITAL_COMMUNITY): Payer: Self-pay | Admitting: Otolaryngology

## 2021-07-28 DIAGNOSIS — H918X9 Other specified hearing loss, unspecified ear: Secondary | ICD-10-CM

## 2021-07-28 DIAGNOSIS — I495 Sick sinus syndrome: Secondary | ICD-10-CM

## 2021-07-28 LAB — CUP PACEART REMOTE DEVICE CHECK
Date Time Interrogation Session: 20230516094159
Implantable Lead Implant Date: 20171205
Implantable Lead Implant Date: 20171205
Implantable Lead Location: 753859
Implantable Lead Location: 753860
Implantable Lead Model: 377
Implantable Lead Model: 377
Implantable Lead Serial Number: 49684101
Implantable Lead Serial Number: 49719338
Implantable Pulse Generator Implant Date: 20171205
Pulse Gen Model: 407145
Pulse Gen Serial Number: 68906976

## 2021-08-12 NOTE — Progress Notes (Signed)
Remote pacemaker transmission.   

## 2021-08-16 NOTE — Progress Notes (Unsigned)
Cardiology Office Note:    Date:  08/18/2021   ID:  Lindsey Murphy, DOB Dec 13, 1955, MRN [id number]  PCP:  Maris Berger, MD  Cardiologist:  Shirlee More, MD    Referring MD: Maris Berger, MD    ASSESSMENT:    1. Coronary artery disease of native artery of native heart with stable angina pectoris ([place])   2. Sick sinus syndrome ([place])   3. Pacemaker   4. PVC's (premature ventricular contractions)   5. Chronic combined systolic and diastolic congestive heart failure ([place])   6. Mixed hyperlipidemia   7. Epistaxis    PLAN:    In order of problems listed above:  Lindsey Murphy is doing very well with her heart disease New York Heart Association class I having no angina we will continue treatment including aspirin her calcium channel blocker beta-blocker and high intensity statin.  At this time I would not repeat an ischemia evaluation Arrhythmia under good control asymptomatic pacemaker function normal following device clinic and PVCs are very symptomatic been nicely suppressed mexiletine. Cardiomyopathy heart failure stable DEXA low-dose of a loop diuretic beta-blocker and will plan to recheck an echocardiogram after next visit Lipids at target 03/24/2021 LDL 68 cholesterol 136 continue her high intensity statin No recurrence she transitioned from clopidogrel to aspirin last visit   Next appointment: 6 months   Medication Adjustments/Labs and Tests Ordered: Current medicines are reviewed at length with the patient today.  Concerns regarding medicines are outlined above.  No orders of the defined types were placed in this encounter.  No orders of the defined types were placed in this encounter.   Chief Complaint  Patient presents with   Follow-up   Coronary Artery Disease   PVC's    History of Present Illness:    Lindsey Murphy is a 66 y.o. 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  She has been evaluated by CT surgery [facility] for anomalous coronary artery felt to be benign and not advised surgical revascularization.  She was deprescribed clopidogrel at her last visit because of epistasis requiring nasal pack.    Compliance with diet, lifestyle and medications: Yes  She is on no further epistasis She really has done quite well she considers herself normal and is having no angina edema shortness of breath chest pain palpitation or syncope She had been involved in a clinical study with a GLP-1 agonist which is stopped She is here to discuss with her family doctor using [employer] for weight loss She has an upcoming appointment in June and she will need to have labs checked including CMP and lipid profile She is not having palpitation on mexiletine Past Medical History:  Diagnosis Date   Acute cystitis 06/25/2016   Arthritis    Asthma with hay fever, mild intermittent, uncomplicated [id number]   Bell's palsy 08/07/2019   Bile reflux esophagitis 04/29/2015   BMI 40.0-44.9, adult ([place]) 08/23/2016   Cellulitis of lower limb 05/24/2017   Chest pain 06/25/2016   Chronic diastolic congestive heart failure ([place]) 07/06/2016   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 Overview:  LCFx, benign variant Formatting  of this note might be different from the original. LCFx, benign variant   Coronary artery disease of native artery of native heart with stable angina pectoris (HCC) 04/29/2015   Drug therapy 04/27/2015   Frequent PVCs 10/05/2015   Overview:  With normal LV EF% on a beta blocker  Formatting of this note might be different from the original. With  normal LV EF% on a beta blocker   GERD (gastroesophageal reflux disease)    Hay fever 11/24/2015   Heart murmur    High risk medication use 04/27/2017   Flecanide   History of hip replacement 12/17/2014   History of repair of hip joint 12/17/2014   Hx of Bell's palsy    Hyperlipidemia 04/27/2015   Hypertension    Hypertensive heart disease with chronic systolic congestive heart failure (HCC) 07/22/2016   IBS (irritable bowel syndrome) 04/29/2015   Ischemic cardiomyopathy 09/15/2016   Cath with nonSTEMI 06/25/2016 Conclusions:  Normal coronary arteries. Anomalous dominant circ from R sinus Mild inferior segmental LV systolic dysfunction  CTA 07/21/16 ejection fraction 46%  Formatting of this note might be different from the original. Overview:  Cath with nonSTEMI 06/25/2016 Conclusions:  Normal coronary arteries. Anomalous dominant circ from R sinus Mild inferior segmental LV sys   LFT elevation 02/28/2017   LVH (left ventricular hypertrophy) 04/29/2015   Migraine 04/29/2015   Mild CAD 11/21/2014   Myocardial infarction [facility])    12/2012 - vasospasms    NSTEMI (non-ST elevated myocardial infarction) (HCC) 06/25/2016   Old MI (myocardial infarction) 04/27/2015   NSTEMI 2014 and recent NSTEMI 06/25/16      Formatting of this note might be different from the original. Overview:  NSTEMI 2014 and recent NSTEMI 06/25/16   OSA on CPAP 04/29/2015   Osteoarthritis 04/29/2015   Pacemaker 02/18/2016   Dual chamber BIOTRONIC [place]        Pacemaker lead failure 02/23/2016   Pacemaker reprogramming/check 02/17/2016   Pain in left knee 05/04/2017   Pain in right foot 05/04/2017   Peripheral vascular disease (HCC)    Pink eye disease of left eye    diagnosed 12/06/2014    Pneumonia    hx of pneumonia    Preoperative cardiovascular examination 11/21/2014   Psoriasis 05/18/2015   Psoriatic arthritis (HCC) 04/29/2015   S/P left THA, AA 12/17/2014   Sciatica, right side 04/29/2015   Seasonal allergies 08/07/2019   Severe sinus  bradycardia 09/15/2016   Sick sinus syndrome (HCC) 02/18/2016   Sinus bradycardia 01/01/2016   Sleep apnea    cpap- does not know settings   Thrombophlebitis of superficial veins of left lower extremity 02/26/2019   Thyroid nodule 03/16/2017   TSH elevation 09/08/2018   Varicose veins of bilateral lower extremities with pain 03/20/2019   Venous insufficiency 04/29/2015   Vitamin D deficiency 04/29/2015    Past Surgical History:  Procedure Laterality Date   acterior cervical fusion      left eye duct eye surgery      right knee surgery      TOTAL HIP ARTHROPLASTY Left 12/17/2014   Procedure: LEFT TOTAL HIP ARTHROPLASTY ANTERIOR APPROACH;  Surgeon: Durene Romans, MD;  Location: [facility];  Service: Orthopedics;  Laterality: Left;    Current Medications: Current Meds  Medication Sig   Albuterol Sulfate (PROAIR RESPICLICK) 108 (90 Base) MCG/ACT AEPB Inhale 2 puffs into the lungs every 4 (four) hours as needed (Wheezing).   amLODipine (NORVASC) 2.5 MG tablet Take 1 tablet (2.5 mg total) by  mouth daily.   aspirin EC 81 MG tablet Take 1 tablet (81 mg total) by mouth daily. Swallow whole.   atorvastatin (LIPITOR) 10 MG tablet Take 1 tablet (10 mg total) by mouth daily.   Azelastine HCl 0.15 % SOLN Place 1 spray into the nose 2 (two) times daily.    calcium-vitamin D (OSCAL WITH D) 500-200 MG-UNIT TABS tablet Take 1 tablet by mouth 2 (two) times daily.   Cholecalciferol (VITAMIN D PO) Take 5,000 Units by mouth daily.    COSENTYX SENSOREADY 300 DOSE 150 MG/ML SOAJ Inject 300 mg into the skin every 28 (twenty-eight) days.    fluticasone (FLONASE) 50 MCG/ACT nasal spray Place 2 sprays into both nostrils daily. Bedtime   furosemide (LASIX) 20 MG tablet Take 20 mg by mouth 2 (two) times daily.    glucosamine-chondroitin 500-400 MG tablet Take 1 tablet by mouth 2 (two) times daily.    guaiFENesin (MUCINEX) 600 MG 12 hr tablet Take 600 mg by mouth at bedtime.    halobetasol (ULTRAVATE) 0.05 % ointment Apply 1  application topically 2 (two) times daily as needed (Psoriasis . rash).   loratadine (CLARITIN) 10 MG tablet Take 10 mg by mouth daily.   metoprolol succinate (TOPROL-XL) 50 MG 24 hr tablet Take 1 tablet (50 mg total) by mouth 2 (two) times daily. Take with or immediately following a meal.   mexiletine (MEXITIL) 200 MG capsule Take 1 capsule (200 mg total) by mouth 2 (two) times daily.   montelukast (SINGULAIR) 10 MG tablet Take 10 mg by mouth at bedtime.   Multiple Vitamin (MULTIVITAMIN WITH MINERALS) TABS tablet Take 1 tablet by mouth daily.   nitroGLYCERIN (NITROSTAT) 0.4 MG SL tablet Place 1 tablet (0.4 mg total) under the tongue every 5 (five) minutes as needed for chest pain.   Omega-3 Fatty Acids (SALMON OIL-1000 PO) Take 1 tablet by mouth 2 (two) times daily.   pantoprazole (PROTONIX) 40 MG tablet Take 40 mg by mouth daily as needed (Acid reflux).   potassium chloride (K-DUR,KLOR-CON) 10 MEQ tablet Take 1 tablet (10 mEq total) by mouth daily.   vitamin C (ASCORBIC ACID) 500 MG tablet Take 500 mg by mouth daily.   Wheat Dextrin (BENEFIBER PO) Take 1 Scoop by mouth daily.     Allergies:   Penicillins, Polymyxin b-trimethoprim, Tape, Etanercept, Lovastatin, Meloxicam, Polymyxin b, and Semaglutide   Social History   Socioeconomic History   Marital status: Widowed    Spouse name: Not on file   Number of children: Not on file   Years of education: Not on file   Highest education level: Not on file  Occupational History   Not on file  Tobacco Use   Smoking status: Never    Passive exposure: Never   Smokeless tobacco: Never  Vaping Use   Vaping Use: Never used  Substance and Sexual Activity   Alcohol use: No   Drug use: No   Sexual activity: Not on file  Other Topics Concern   Not on file  Social History Narrative   Not on file   Social Determinants of Health   Financial Resource Strain: Not on file  Food Insecurity: Not on file  Transportation Needs: Not on file   Physical Activity: Not on file  Stress: Not on file  Social Connections: Not on file     Family History: The patient's family history includes Heart attack in her maternal grandmother; Heart block in her mother; Stroke in her mother; Valvular heart disease  in her mother. ROS:   Please see the history of present illness.    All other systems reviewed and are negative.  EKGs/Labs/Other Studies Reviewed:    The following studies were reviewed today:  She had an echocardiogram 09/09/2020 left ventricular ejection fraction remains mildly reduced 45 to 50% with grade 2 diastolic dysfunction.  The right ventricle is normal in size function and pulmonary artery pressure.  There is moderate mitral regurgitation functional.  1. Left ventricular ejection fraction, by estimation, is 45 to 50%. The  left ventricle has mildly decreased function. The left ventricle has no  regional wall motion abnormalities. The left ventricular internal cavity  size was moderately dilated. Left  ventricular diastolic parameters are consistent with Grade II diastolic  dysfunction (pseudonormalization). The average left ventricular global  longitudinal strain is -13.1 %. The global longitudinal strain is  abnormal.   2. Right ventricular systolic function is normal. The right ventricular  size is normal. There is normal pulmonary artery systolic pressure.   3. Moderate eccentric posterioly directed mitral valve regurgitation. No  evidence of mitral stenosis.   4. The aortic valve is normal in structure. Aortic valve regurgitation is  not visualized. No aortic stenosis is present.   5. The inferior vena cava is normal in size with greater than 50%  respiratory variability, suggesting right atrial pressure of 3 mmHg.    Recent Labs: 03/24/2021: ALT 31; BUN 14; Creatinine, Ser 0.82; Potassium 3.9; Sodium 141  Recent Lipid Panel    Component Value Date/Time   CHOL 136 03/24/2021 1132   TRIG 137 03/24/2021 1132    HDL 44 03/24/2021 1132   CHOLHDL 3.1 03/24/2021 1132   LDLCALC 68 03/24/2021 1132    Physical Exam:    VS:  BP 110/60 (BP Location: Left Arm, Patient Position: Sitting)   Murphy 72   Ht 5\' 4"  (1.626 m)   Wt 234 lb 6.4 oz (106.3 kg)   SpO2 95%   BMI 40.23 kg/m     Wt Readings from Last 3 Encounters:  08/18/21 234 lb 6.4 oz (106.3 kg)  03/24/21 234 lb 9.6 oz (106.4 kg)  11/24/20 236 lb 12.8 oz (107.4 kg)     GEN:  Well nourished, well developed in no acute distress HEENT: Normal NECK: No JVD; No carotid bruits LYMPHATICS: No lymphadenopathy CARDIAC: RRR, no murmurs, rubs, gallops RESPIRATORY:  Clear to auscultation without rales, wheezing or rhonchi  ABDOMEN: Soft, non-tender, non-distended MUSCULOSKELETAL:  No edema; No deformity  SKIN: Warm and dry NEUROLOGIC:  Alert and oriented x 3 PSYCHIATRIC:  Normal affect    Signed, Shirlee More, MD  08/18/2021 1:28 PM     [facility]

## 2021-08-18 ENCOUNTER — Other Ambulatory Visit: Payer: Self-pay

## 2021-08-18 ENCOUNTER — Encounter: Payer: Self-pay | Admitting: Cardiology

## 2021-08-18 ENCOUNTER — Ambulatory Visit (INDEPENDENT_AMBULATORY_CARE_PROVIDER_SITE_OTHER): Payer: Medicare Other | Admitting: Cardiology

## 2021-08-18 VITALS — BP 110/60 | HR 72 | Ht 64.0 in | Wt 234.4 lb

## 2021-08-18 DIAGNOSIS — I493 Ventricular premature depolarization: Secondary | ICD-10-CM

## 2021-08-18 DIAGNOSIS — I25118 Atherosclerotic heart disease of native coronary artery with other forms of angina pectoris: Secondary | ICD-10-CM | POA: Diagnosis not present

## 2021-08-18 DIAGNOSIS — I495 Sick sinus syndrome: Secondary | ICD-10-CM

## 2021-08-18 DIAGNOSIS — I5042 Chronic combined systolic (congestive) and diastolic (congestive) heart failure: Secondary | ICD-10-CM

## 2021-08-18 DIAGNOSIS — R040 Epistaxis: Secondary | ICD-10-CM

## 2021-08-18 DIAGNOSIS — Z950 Presence of cardiac pacemaker: Secondary | ICD-10-CM

## 2021-08-18 DIAGNOSIS — E782 Mixed hyperlipidemia: Secondary | ICD-10-CM

## 2021-08-18 MED ORDER — NITROGLYCERIN 0.4 MG SL SUBL: 0.4000 mg | SUBLINGUAL_TABLET | SUBLINGUAL | 3 refills | Status: AC | PRN

## 2021-08-18 NOTE — Patient Instructions (Signed)

## 2021-08-28 DIAGNOSIS — K2100 Gastro-esophageal reflux disease with esophagitis, without bleeding: Secondary | ICD-10-CM | POA: Diagnosis not present

## 2021-08-28 DIAGNOSIS — R7989 Other specified abnormal findings of blood chemistry: Secondary | ICD-10-CM | POA: Diagnosis not present

## 2021-08-28 DIAGNOSIS — R1011 Right upper quadrant pain: Secondary | ICD-10-CM | POA: Diagnosis not present

## 2021-09-14 ENCOUNTER — Ambulatory Visit (HOSPITAL_COMMUNITY)
Admission: RE | Admit: 2021-09-14 | Discharge: 2021-09-14 | Disposition: A | Discharge status: Home / Self Care | Payer: Medicare Other | Source: Ambulatory Visit | Attending: Physician Assistant | Admitting: Physician Assistant

## 2021-09-14 ENCOUNTER — Ambulatory Visit (HOSPITAL_COMMUNITY)
Admission: RE | Admit: 2021-09-14 | Discharge: 2021-09-14 | Disposition: A | Discharge status: Home / Self Care | Payer: Medicare Other | Source: Ambulatory Visit | Attending: Otolaryngology | Admitting: Otolaryngology

## 2021-09-14 DIAGNOSIS — Z950 Presence of cardiac pacemaker: Secondary | ICD-10-CM | POA: Diagnosis not present

## 2021-09-14 DIAGNOSIS — H918X9 Other specified hearing loss, unspecified ear: Secondary | ICD-10-CM | POA: Diagnosis not present

## 2021-09-14 MED ORDER — GADOBUTROL 1 MMOL/ML IV SOLN
10.0000 mL | Freq: Once | INTRAVENOUS | Status: AC | PRN
Start: 2021-09-14 — End: 2021-09-14
  Administered 2021-09-14: 10 mL via INTRAVENOUS

## 2021-09-14 NOTE — Progress Notes (Signed)
[facility] here today at [facility] for [facility] brain/IAC w wo contrast. Patient has Biotronik device. Adam-rep at the bedside to program. Auto detect on at this time. No further need from RN at this time

## 2021-10-01 DIAGNOSIS — G4733 Obstructive sleep apnea (adult) (pediatric): Secondary | ICD-10-CM | POA: Diagnosis not present

## 2021-10-12 ENCOUNTER — Encounter: Payer: Self-pay | Admitting: Cardiology

## 2021-10-12 ENCOUNTER — Telehealth: Payer: Medicare Other | Admitting: Physician Assistant

## 2021-10-12 DIAGNOSIS — U071 COVID-19: Secondary | ICD-10-CM

## 2021-10-12 NOTE — Progress Notes (Signed)
Had medication questions of Paxlovid. Answered questions for patient to the best of my ability. Will No charge since she has had a video visit with her PCP already today.

## 2021-10-27 ENCOUNTER — Ambulatory Visit (INDEPENDENT_AMBULATORY_CARE_PROVIDER_SITE_OTHER): Payer: Medicare Other

## 2021-10-27 DIAGNOSIS — I495 Sick sinus syndrome: Secondary | ICD-10-CM | POA: Diagnosis not present

## 2021-10-27 LAB — CUP PACEART REMOTE DEVICE CHECK
Date Time Interrogation Session: 20230815065818
Implantable Lead Implant Date: 20171205
Implantable Lead Implant Date: 20171205
Implantable Lead Location: 753859
Implantable Lead Location: 753860
Implantable Lead Model: 377
Implantable Lead Model: 377
Implantable Lead Serial Number: 49684101
Implantable Lead Serial Number: 49719338
Implantable Pulse Generator Implant Date: 20171205
Pulse Gen Model: 407145
Pulse Gen Serial Number: 68906976

## 2021-10-28 DIAGNOSIS — Z1231 Encounter for screening mammogram for malignant neoplasm of breast: Secondary | ICD-10-CM | POA: Diagnosis not present

## 2021-11-27 NOTE — Progress Notes (Signed)
Remote pacemaker transmission.   

## 2022-01-02 DIAGNOSIS — L299 Pruritus, unspecified: Secondary | ICD-10-CM | POA: Diagnosis not present

## 2022-01-02 DIAGNOSIS — L400 Psoriasis vulgaris: Secondary | ICD-10-CM | POA: Diagnosis not present

## 2022-01-26 ENCOUNTER — Ambulatory Visit: Payer: Medicare Other

## 2022-01-26 DIAGNOSIS — I495 Sick sinus syndrome: Secondary | ICD-10-CM

## 2022-01-27 LAB — CUP PACEART REMOTE DEVICE CHECK
Date Time Interrogation Session: 20231115072737
Implantable Lead Connection Status: 753985
Implantable Lead Connection Status: 753985
Implantable Lead Implant Date: 20171205
Implantable Lead Implant Date: 20171205
Implantable Lead Location: 753859
Implantable Lead Location: 753860
Implantable Lead Model: 377
Implantable Lead Model: 377
Implantable Lead Serial Number: 49684101
Implantable Lead Serial Number: 49719338
Implantable Pulse Generator Implant Date: 20171205
Pulse Gen Model: 407145
Pulse Gen Serial Number: 68906976

## 2022-02-22 NOTE — Progress Notes (Signed)
Remote pacemaker transmission.   

## 2022-04-19 ENCOUNTER — Ambulatory Visit: Payer: Medicare Other | Attending: Cardiology | Admitting: Cardiology

## 2022-04-19 ENCOUNTER — Encounter: Payer: Self-pay | Admitting: Cardiology

## 2022-04-19 VITALS — BP 118/84 | HR 71 | Ht 64.0 in | Wt 232.0 lb

## 2022-04-19 DIAGNOSIS — I493 Ventricular premature depolarization: Secondary | ICD-10-CM

## 2022-04-19 DIAGNOSIS — R001 Bradycardia, unspecified: Secondary | ICD-10-CM | POA: Diagnosis not present

## 2022-04-19 LAB — CUP PACEART INCLINIC DEVICE CHECK
Brady Statistic RA Percent Paced: 48 %
Brady Statistic RV Percent Paced: 1 %
Date Time Interrogation Session: 20240205171013
Implantable Lead Connection Status: 753985
Implantable Lead Connection Status: 753985
Implantable Lead Implant Date: 20171205
Implantable Lead Implant Date: 20171205
Implantable Lead Location: 753859
Implantable Lead Location: 753860
Implantable Lead Model: 377
Implantable Lead Model: 377
Implantable Lead Serial Number: 49684101
Implantable Lead Serial Number: 49719338
Implantable Pulse Generator Implant Date: 20171205
Lead Channel Impedance Value: 546 Ohm
Lead Channel Impedance Value: 565 Ohm
Lead Channel Pacing Threshold Amplitude: 0.8 V
Lead Channel Pacing Threshold Amplitude: 1.2 V
Lead Channel Pacing Threshold Pulse Width: 0.4 ms
Lead Channel Pacing Threshold Pulse Width: 0.4 ms
Lead Channel Sensing Intrinsic Amplitude: 1.4 mV
Lead Channel Sensing Intrinsic Amplitude: 546 mV
Lead Channel Sensing Intrinsic Amplitude: 565 mV
Lead Channel Sensing Intrinsic Amplitude: 7.7 mV
Pulse Gen Model: 407145
Pulse Gen Serial Number: 68906976

## 2022-04-19 NOTE — Patient Instructions (Signed)
Medication Instructions:  Your physician recommends that you continue on your current medications as directed. Please refer to the Current Medication list given to you today.  *If you need a refill on your cardiac medications before your next appointment, please call your pharmacy*   Lab Work: None ordered   Testing/Procedures: None ordered   Follow-Up: At [facility], you and your health needs are our priority.  As part of our continuing mission to provide you with exceptional heart care, we have created designated Provider Care Teams.  These Care Teams include your primary Cardiologist (physician) and Advanced Practice Providers (APPs -  Physician Assistants and Nurse Practitioners) who all work together to provide you with the care you need, when you need it.  Remote monitoring is used to monitor your Pacemaker or ICD from home. This monitoring reduces the number of office visits required to check your device to one time per year. It allows [place] to keep an eye on the functioning of your device to ensure it is working properly. You are scheduled for a device check from home on 04/27/2022. You may send your transmission at any time that day. If you have a wireless device, the transmission will be sent automatically. After your physician reviews your transmission, you will receive a postcard with your next transmission date.  Your next appointment:   1 year(s)  The format for your next appointment:   In Person  Provider:   Allegra Lai, MD    Thank you for choosing [facility]!!   Trinidad Curet, RN [phone]    Other Instructions

## 2022-04-19 NOTE — Progress Notes (Addendum)
Electrophysiology Office Note   Date:  04/19/2022   ID:  Lindsey Murphy, DOB 10/21/1955, MRN [id number]  PCP:  Maris Berger, MD  Cardiologist:  Bettina Gavia Primary Electrophysiologist:  Ayiden Milliman Meredith Leeds, MD    No chief complaint on file.     History of Present Illness: Lindsey Murphy is a 67 y.o. female who is being seen today for the evaluation of PVCs, systolic heart failure at the request of Maris Berger, MD. Presenting today for electrophysiology evaluation.    Has a history sniffer diastolic heart failure, congenital coronary anomaly, hyperlipidemia, symptomatic bradycardia post Biotronik pacemaker, PVCs.  She had an 8% burden on her last interrogation and was started on mexiletine.  Today, denies symptoms of palpitations, chest pain, shortness of breath, orthopnea, PND, lower extremity edema, claudication, dizziness, presyncope, syncope, bleeding, or neurologic sequela. The patient is tolerating medications without difficulties.  Been doing well over the last year.  She did note an increase in her PVCs.  She states that her mother, she is the primary caregiver, likes to go out to eat and her medications have not been on time.  Since being on time with her medications, she is felt much improved.   Past Medical History:  Diagnosis Date   Acute cystitis 06/25/2016   Arthritis    Asthma with hay fever, mild intermittent, uncomplicated 0/17/4944   Bell's palsy 08/07/2019   Bile reflux esophagitis 04/29/2015   BMI 40.0-44.9, adult ([place]) 08/23/2016   Cellulitis of lower limb 05/24/2017   Chest pain 06/25/2016   Chronic diastolic congestive heart failure ([place]) 07/06/2016   Congenital anomaly of coronary artery 11/21/2014   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 Overview:  LCFx, benign variant Formatting of this note might be different from the original. LCFx, benign variant    Coronary artery disease of native artery of native heart with stable angina pectoris ([place]) 04/29/2015   Drug therapy 04/27/2015   Frequent PVCs 10/05/2015   Overview:  With normal LV EF% on a beta blocker  Formatting of this note might be different from the original. With normal LV EF% on a beta blocker   GERD (gastroesophageal reflux disease)    Hay fever 11/24/2015   Heart murmur    High risk medication use 04/27/2017   Flecanide   History of hip replacement 12/17/2014   History of repair of hip joint 12/17/2014   Hx of Bell's palsy    Hyperlipidemia 04/27/2015   Hypertension    Hypertensive heart disease with chronic systolic congestive heart failure ([place]) 07/22/2016   IBS (irritable bowel syndrome) 04/29/2015   Ischemic cardiomyopathy 09/15/2016   Cath with nonSTEMI 06/25/2016 Conclusions:  Normal coronary arteries. Anomalous dominant circ from R sinus Mild inferior segmental LV systolic dysfunction  CTA 07/21/16 ejection fraction 46%  Formatting of this note might be different from the original. Overview:  Cath with nonSTEMI 06/25/2016 Conclusions:  Normal coronary arteries. Anomalous dominant circ from R sinus Mild inferior segmental LV sys   LFT elevation 02/28/2017   LVH (left ventricular hypertrophy) 04/29/2015   Migraine 04/29/2015   Mild CAD 11/21/2014   Myocardial infarction [facility])    12/2012 - vasospasms    NSTEMI (non-ST elevated myocardial infarction) ([place]) 06/25/2016   Old MI (myocardial infarction) 04/27/2015   NSTEMI 2014 and recent NSTEMI 06/25/16      Formatting of this note might be  different from the original. Overview:  NSTEMI 2014 and recent NSTEMI 06/25/16   OSA on CPAP 04/29/2015   Osteoarthritis 04/29/2015   Pacemaker 02/18/2016   Dual chamber BIOTRONIC [place]        Pacemaker lead failure 02/23/2016   Pacemaker reprogramming/check 02/17/2016   Pain in left knee 05/04/2017   Pain in right foot 05/04/2017   Peripheral vascular disease ([place])    Pink eye disease of left eye     diagnosed 12/06/2014    Pneumonia    hx of pneumonia    Preoperative cardiovascular examination 11/21/2014   Psoriasis 05/18/2015   Psoriatic arthritis ([place]) 04/29/2015   S/P left THA, AA 12/17/2014   Sciatica, right side 04/29/2015   Seasonal allergies 08/07/2019   Severe sinus bradycardia 09/15/2016   Sick sinus syndrome ([place]) 02/18/2016   Sinus bradycardia 01/01/2016   Sleep apnea    cpap- does not know settings   Thrombophlebitis of superficial veins of left lower extremity 02/26/2019   Thyroid nodule 03/16/2017   TSH elevation 09/08/2018   Varicose veins of bilateral lower extremities with pain 03/20/2019   Venous insufficiency 04/29/2015   Vitamin D deficiency 04/29/2015   Past Surgical History:  Procedure Laterality Date   acterior cervical fusion      left eye duct eye surgery      right knee surgery      TOTAL HIP ARTHROPLASTY Left 12/17/2014   Procedure: LEFT TOTAL HIP ARTHROPLASTY ANTERIOR APPROACH;  Surgeon: Paralee Cancel, MD;  Location: [facility];  Service: Orthopedics;  Laterality: Left;     Current Outpatient Medications  Medication Sig Dispense Refill   Albuterol Sulfate (PROAIR RESPICLICK) 878 (90 Base) MCG/ACT AEPB Inhale 2 puffs into the lungs every 4 (four) hours as needed (Wheezing).     amLODipine (NORVASC) 2.5 MG tablet Take 1 tablet (2.5 mg total) by mouth daily. 90 tablet 3   aspirin EC 81 MG tablet Take 1 tablet (81 mg total) by mouth daily. Swallow whole. 90 tablet 3   atorvastatin (LIPITOR) 10 MG tablet Take 1 tablet (10 mg total) by mouth daily. 90 tablet 3   Azelastine HCl 0.15 % SOLN Place 1 spray into the nose 2 (two) times daily.      calcium-vitamin D (OSCAL WITH D) 500-200 MG-UNIT TABS tablet Take 1 tablet by mouth 2 (two) times daily.     Cholecalciferol (VITAMIN D PO) Take 5,000 Units by mouth daily.      COSENTYX SENSOREADY 300 DOSE 150 MG/ML SOAJ Inject 300 mg into the skin every 28 (twenty-eight) days.      famotidine (PEPCID) 20 MG tablet Take 20 mg by mouth  daily as needed for heartburn or indigestion.     fluticasone (FLONASE) 50 MCG/ACT nasal spray Place 2 sprays into both nostrils daily. Bedtime     furosemide (LASIX) 20 MG tablet Take 20 mg by mouth 2 (two) times daily.      glucosamine-chondroitin 500-400 MG tablet Take 1 tablet by mouth 2 (two) times daily.      guaiFENesin (MUCINEX) 600 MG 12 hr tablet Take 600 mg by mouth at bedtime.      halobetasol (ULTRAVATE) 0.05 % ointment Apply 1 application topically 2 (two) times daily as needed (Psoriasis . rash).     loratadine (CLARITIN) 10 MG tablet Take 10 mg by mouth daily.     metoprolol succinate (TOPROL-XL) 50 MG 24 hr tablet Take 1 tablet (50 mg total) by mouth 2 (two) times daily. Take with  or immediately following a meal. 180 tablet 1   mexiletine (MEXITIL) 200 MG capsule Take 1 capsule (200 mg total) by mouth 2 (two) times daily. 180 capsule 2   montelukast (SINGULAIR) 10 MG tablet Take 10 mg by mouth at bedtime.     Multiple Vitamin (MULTIVITAMIN WITH MINERALS) TABS tablet Take 1 tablet by mouth daily.     nitroGLYCERIN (NITROSTAT) 0.4 MG SL tablet Place 1 tablet (0.4 mg total) under the tongue every 5 (five) minutes as needed for chest pain. 100 tablet 3   Omega-3 Fatty Acids (SALMON OIL-1000 PO) Take 1 tablet by mouth 2 (two) times daily.     potassium chloride (K-DUR,KLOR-CON) 10 MEQ tablet Take 1 tablet (10 mEq total) by mouth daily. 90 tablet 0   vitamin C (ASCORBIC ACID) 500 MG tablet Take 500 mg by mouth daily.     No current facility-administered medications for this visit.    Allergies:   Penicillins, Polymyxin b-trimethoprim, Tape, Etanercept, Lovastatin, Meloxicam, and Semaglutide   Social History:  The patient  reports that she has never smoked. She has never been exposed to tobacco smoke. She has never used smokeless tobacco. She reports that she does not drink alcohol and does not use drugs.   Family History:  The patient's family history includes Heart attack in her  maternal grandmother; Heart block in her mother; Stroke in her mother; Valvular heart disease in her mother.   ROS:  Please see the history of present illness.   Otherwise, review of systems is positive for none.   All other systems are reviewed and negative.   PHYSICAL EXAM: VS:  BP 118/84   Pulse 71   Ht 5\' 4"  (1.626 m)   Wt 232 lb (105.2 kg)   SpO2 97%   BMI 39.82 kg/m  , BMI Body mass index is 39.82 kg/m. GEN: Well nourished, well developed, in no acute distress  HEENT: normal  Neck: no JVD, carotid bruits, or masses Cardiac: RRR; no murmurs, rubs, or gallops,no edema  Respiratory:  clear to auscultation bilaterally, normal work of breathing GI: soft, nontender, nondistended, + BS MS: no deformity or atrophy  Skin: warm and dry, device site well healed Neuro:  Strength and sensation are intact Psych: euthymic mood, full affect  EKG:  EKG is ordered today. Personal review of the ekg ordered shows sinus rhythm  Personal review of the device interrogation today. Results in [facility]   Recent Labs: No results found for requested labs within last 365 days.    Lipid Panel     Component Value Date/Time   CHOL 136 03/24/2021 1132   TRIG 137 03/24/2021 1132   HDL 44 03/24/2021 1132   CHOLHDL 3.1 03/24/2021 1132   LDLCALC 68 03/24/2021 1132     Wt Readings from Last 3 Encounters:  04/19/22 232 lb (105.2 kg)  08/18/21 234 lb 6.4 oz (106.3 kg)  03/24/21 234 lb 9.6 oz (106.4 kg)      Other studies Reviewed: Additional studies/ records that were reviewed today include: TTE  04/05/16 Review of the above records today demonstrates:  LV cavity mild to moderately dilated.  Ejection fraction 45-50% Mildly dilated left atrium Trace aortic regurgitation Structurally normal mitral valve  ETT 02/02/17 Blood pressure demonstrated a hypertensive response to exercise. There was no ST segment deviation noted during stress.   Normal ETT No ischemia HTN response to exercise    ASSESSMENT AND PLAN:  1.  PVCs: Currently on mexiletine 200 mg twice daily.  High risk medication monitoring.  PVCs have been multifocal.  None on her ECG today and none on device interrogation.  Continue with current management.  2.  Mild systolic and diastolic heart failure: No evidence of volume overload  3.  Hyperlipidemia: Continue atorvastatin 10 mg per primary cardiology  4.  Congenital anomaly of the coronary artery: Currently on Plavix, Toprol-XL, atorvastatin dose as above.  No current chest pain.  5.  Symptomatic bradycardia: Status post Biotronik dual-chamber pacemaker.  Device functioning appropriately.  No changes at this time.  I was chaperoned during this clinic visit by Trinidad Curet, Myrtie Hawk, Geoffry Paradise.  Current medicines are reviewed at length with the patient today.   The patient does not have concerns regarding her medicines.  The following changes were made today: none  Labs/ tests ordered today include:  Orders Placed This Encounter  Procedures   EKG 12-Lead      Disposition:   FU with Mirela Parsley 12 months  Signed, Elfriede Bonini Meredith Leeds, MD  04/19/2022 4:41 PM     [place] [place] [address] [phone] (office) [phone] (fax)

## 2022-04-21 NOTE — Progress Notes (Unsigned)
Cardiology Office Note:    Date:  04/21/2022   ID:  Lindsey Murphy, DOB 08-Jul-1955, MRN [id number]  PCP:  Maris Berger, MD  Cardiologist:  Shirlee More, MD    Referring MD: Maris Berger, MD    ASSESSMENT:    No diagnosis found. PLAN:    In order of problems listed above:  ***   Next appointment: ***   Medication Adjustments/Labs and Tests Ordered: Current medicines are reviewed at length with the patient today.  Concerns regarding medicines are outlined above.  No orders of the defined types were placed in this encounter.  No orders of the defined types were placed in this encounter.   No chief complaint on file.   History of Present Illness:    Lindsey Murphy is a 67 y.o. female with a hx of complex heart disease including anomalous origin left circumflex coronary artery history of myocardial infarction or nonobstructive CAD heart failure with mildly reduced ejection fraction 46% moderate bradycardia dual-chamber pacemaker hypertensive heart disease and frequent PVCs on mexiletine suppression.  She was last seen 08/18/2021. Compliance with diet, lifestyle and medications: *** Past Medical History:  Diagnosis Date   Acute cystitis 06/25/2016   Arthritis    Asthma with hay fever, mild intermittent, uncomplicated 7/37/1062   Bell's palsy 08/07/2019   Bile reflux esophagitis 04/29/2015   BMI 40.0-44.9, adult ([place]) 08/23/2016   Cellulitis of lower limb 05/24/2017   Chest pain 06/25/2016   Chronic diastolic congestive heart failure ([employer]) 07/06/2016   Congenital anomaly of coronary artery 11/21/2014   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 Overview:  LCFx, benign variant Formatting of this note might be different from the original. LCFx, benign variant   Coronary artery disease of native artery of native heart with stable angina pectoris ([place]) 04/29/2015   Drug therapy  04/27/2015   Frequent PVCs 10/05/2015   Overview:  With normal LV EF% on a beta blocker  Formatting of this note might be different from the original. With normal LV EF% on a beta blocker   GERD (gastroesophageal reflux disease)    Hay fever 11/24/2015   Heart murmur    High risk medication use 04/27/2017   Flecanide   History of hip replacement 12/17/2014   History of repair of hip joint 12/17/2014   Hx of Bell's palsy    Hyperlipidemia 04/27/2015   Hypertension    Hypertensive heart disease with chronic systolic congestive heart failure ([place]) 07/22/2016   IBS (irritable bowel syndrome) 04/29/2015   Ischemic cardiomyopathy 09/15/2016   Cath with nonSTEMI 06/25/2016 Conclusions:  Normal coronary arteries. Anomalous dominant circ from R sinus Mild inferior segmental LV systolic dysfunction  CTA 07/21/16 ejection fraction 46%  Formatting of this note might be different from the original. Overview:  Cath with nonSTEMI 06/25/2016 Conclusions:  Normal coronary arteries. Anomalous dominant circ from R sinus Mild inferior segmental LV sys   LFT elevation 02/28/2017   LVH (left ventricular hypertrophy) 04/29/2015   Migraine 04/29/2015   Mild CAD 11/21/2014   Myocardial infarction [facility])    12/2012 - vasospasms    NSTEMI (non-ST elevated myocardial infarction) ([place]) 06/25/2016   Old MI (myocardial infarction) 04/27/2015   NSTEMI 2014 and recent NSTEMI 06/25/16      Formatting of this note might be different from the original. Overview:  NSTEMI 2014 and recent NSTEMI 06/25/16   OSA  on CPAP 04/29/2015   Osteoarthritis 04/29/2015   Pacemaker 02/18/2016   Dual chamber BIOTRONIC [place]        Pacemaker lead failure 02/23/2016   Pacemaker reprogramming/check 02/17/2016   Pain in left knee 05/04/2017   Pain in right foot 05/04/2017   Peripheral vascular disease ([place])    Pink eye disease of left eye    diagnosed 12/06/2014    Pneumonia    hx of pneumonia    Preoperative cardiovascular examination 11/21/2014   Psoriasis  05/18/2015   Psoriatic arthritis ([place]) 04/29/2015   S/P left THA, AA 12/17/2014   Sciatica, right side 04/29/2015   Seasonal allergies 08/07/2019   Severe sinus bradycardia 09/15/2016   Sick sinus syndrome ([place]) 02/18/2016   Sinus bradycardia 01/01/2016   Sleep apnea    cpap- does not know settings   Thrombophlebitis of superficial veins of left lower extremity 02/26/2019   Thyroid nodule 03/16/2017   TSH elevation 09/08/2018   Varicose veins of bilateral lower extremities with pain 03/20/2019   Venous insufficiency 04/29/2015   Vitamin D deficiency 04/29/2015    Past Surgical History:  Procedure Laterality Date   acterior cervical fusion      left eye duct eye surgery      right knee surgery      TOTAL HIP ARTHROPLASTY Left 12/17/2014   Procedure: LEFT TOTAL HIP ARTHROPLASTY ANTERIOR APPROACH;  Surgeon: Paralee Cancel, MD;  Location: [facility];  Service: Orthopedics;  Laterality: Left;    Current Medications: No outpatient medications have been marked as taking for the 04/22/22 encounter (Appointment) with Richardo Priest, MD.     Allergies:   Penicillins, Polymyxin b-trimethoprim, Tape, Etanercept, Lovastatin, Meloxicam, and Semaglutide   Social History   Socioeconomic History   Marital status: Widowed    Spouse name: Not on file   Number of children: Not on file   Years of education: Not on file   Highest education level: Not on file  Occupational History   Not on file  Tobacco Use   Smoking status: Never    Passive exposure: Never   Smokeless tobacco: Never  Vaping Use   Vaping Use: Never used  Substance and Sexual Activity   Alcohol use: No   Drug use: No   Sexual activity: Not on file  Other Topics Concern   Not on file  Social History Narrative   Not on file   Social Determinants of Health   Financial Resource Strain: Not on file  Food Insecurity: Not on file  Transportation Needs: Not on file  Physical Activity: Not on file  Stress: Not on file  Social Connections:  Not on file     Family History: The patient's ***family history includes Heart attack in her maternal grandmother; Heart block in her mother; Stroke in her mother; Valvular heart disease in her mother. ROS:   Please see the history of present illness.    All other systems reviewed and are negative.  EKGs/Labs/Other Studies Reviewed:    The following studies were reviewed today:  EKG:  EKG ordered today and personally reviewed.  The ekg ordered today demonstrates ***  Recent Labs: No results found for requested labs within last 365 days.  Recent Lipid Panel    Component Value Date/Time   CHOL 136 03/24/2021 1132   TRIG 137 03/24/2021 1132   HDL 44 03/24/2021 1132   CHOLHDL 3.1 03/24/2021 1132   LDLCALC 68 03/24/2021 1132    Physical Exam:    VS:  There were no vitals taken for this visit.    Wt Readings from Last 3 Encounters:  04/19/22 232 lb (105.2 kg)  08/18/21 234 lb 6.4 oz (106.3 kg)  03/24/21 234 lb 9.6 oz (106.4 kg)     GEN: *** Well nourished, well developed in no acute distress HEENT: Normal NECK: No JVD; No carotid bruits LYMPHATICS: No lymphadenopathy CARDIAC: ***RRR, no murmurs, rubs, gallops RESPIRATORY:  Clear to auscultation without rales, wheezing or rhonchi  ABDOMEN: Soft, non-tender, non-distended MUSCULOSKELETAL:  No edema; No deformity  SKIN: Warm and dry NEUROLOGIC:  Alert and oriented x 3 PSYCHIATRIC:  Normal affect    Signed, Shirlee More, MD  04/21/2022 2:04 PM    [place]

## 2022-04-22 ENCOUNTER — Encounter: Payer: Self-pay | Admitting: Cardiology

## 2022-04-22 ENCOUNTER — Ambulatory Visit: Payer: Medicare Other | Attending: Cardiology | Admitting: Cardiology

## 2022-04-22 VITALS — BP 132/70 | HR 88 | Ht 64.0 in | Wt 230.0 lb

## 2022-04-22 DIAGNOSIS — I5042 Chronic combined systolic (congestive) and diastolic (congestive) heart failure: Secondary | ICD-10-CM

## 2022-04-22 DIAGNOSIS — I493 Ventricular premature depolarization: Secondary | ICD-10-CM | POA: Diagnosis not present

## 2022-04-22 DIAGNOSIS — I25118 Atherosclerotic heart disease of native coronary artery with other forms of angina pectoris: Secondary | ICD-10-CM | POA: Diagnosis not present

## 2022-04-22 DIAGNOSIS — Z950 Presence of cardiac pacemaker: Secondary | ICD-10-CM | POA: Diagnosis not present

## 2022-04-22 DIAGNOSIS — E782 Mixed hyperlipidemia: Secondary | ICD-10-CM

## 2022-04-22 DIAGNOSIS — R001 Bradycardia, unspecified: Secondary | ICD-10-CM

## 2022-04-22 NOTE — Patient Instructions (Signed)
Medication Instructions:  Your physician recommends that you continue on your current medications as directed. Please refer to the Current Medication list given to you today.  *If you need a refill on your cardiac medications before your next appointment, please call your pharmacy*   Lab Work: None If you have labs (blood work) drawn today and your tests are completely normal, you will receive your results only by: MyChart Message (if you have MyChart) OR A paper copy in the mail If you have any lab test that is abnormal or we need to change your treatment, we will call you to review the results.   Testing/Procedures: None   Follow-Up: At [facility], you and your health needs are our priority.  As part of our continuing mission to provide you with exceptional heart care, we have created designated Provider Care Teams.  These Care Teams include your primary Cardiologist (physician) and Advanced Practice Providers (APPs -  Physician Assistants and Nurse Practitioners) who all work together to provide you with the care you need, when you need it.  We recommend signing up for the patient portal called "MyChart".  Sign up information is provided on this After Visit Summary.  MyChart is used to connect with patients for Virtual Visits (Telemedicine).  Patients are able to view lab/test results, encounter notes, upcoming appointments, etc.  Non-urgent messages can be sent to your provider as well.   To learn more about what you can do with MyChart, go to [email or web address].com.    Your next appointment:   6 month(s)  Provider:   Brian Munley, MD    Other Instructions None  This visit was accompanied by Tiffany Garner.   

## 2022-04-22 NOTE — Discharge Summary (Signed)
This visit was accompanied by Burnetta Kohls, CMA.  

## 2022-04-27 ENCOUNTER — Ambulatory Visit: Payer: Medicare Other

## 2022-04-27 DIAGNOSIS — I495 Sick sinus syndrome: Secondary | ICD-10-CM

## 2022-04-27 LAB — CUP PACEART REMOTE DEVICE CHECK
Date Time Interrogation Session: 20240213073922
Implantable Lead Connection Status: 753985
Implantable Lead Connection Status: 753985
Implantable Lead Implant Date: 20171205
Implantable Lead Implant Date: 20171205
Implantable Lead Location: 753859
Implantable Lead Location: 753860
Implantable Lead Model: 377
Implantable Lead Model: 377
Implantable Lead Serial Number: 49684101
Implantable Lead Serial Number: 49719338
Implantable Pulse Generator Implant Date: 20171205
Pulse Gen Model: 407145
Pulse Gen Serial Number: 68906976

## 2022-05-28 NOTE — Progress Notes (Signed)
Remote pacemaker transmission.   

## 2022-06-03 ENCOUNTER — Other Ambulatory Visit: Payer: Self-pay | Admitting: Cardiology

## 2022-06-05 DIAGNOSIS — G4733 Obstructive sleep apnea (adult) (pediatric): Secondary | ICD-10-CM | POA: Diagnosis not present

## 2022-06-19 DIAGNOSIS — L853 Xerosis cutis: Secondary | ICD-10-CM | POA: Diagnosis not present

## 2022-07-27 ENCOUNTER — Ambulatory Visit (INDEPENDENT_AMBULATORY_CARE_PROVIDER_SITE_OTHER): Payer: Medicare Other

## 2022-07-27 DIAGNOSIS — I495 Sick sinus syndrome: Secondary | ICD-10-CM

## 2022-07-27 LAB — CUP PACEART REMOTE DEVICE CHECK
Battery Voltage: 50
Date Time Interrogation Session: 20240514074020
Implantable Lead Connection Status: 753985
Implantable Lead Connection Status: 753985
Implantable Lead Implant Date: 20171205
Implantable Lead Implant Date: 20171205
Implantable Lead Location: 753859
Implantable Lead Location: 753860
Implantable Lead Model: 377
Implantable Lead Model: 377
Implantable Lead Serial Number: 49684101
Implantable Lead Serial Number: 49719338
Implantable Pulse Generator Implant Date: 20171205
Pulse Gen Model: 407145
Pulse Gen Serial Number: 68906976

## 2022-08-12 NOTE — Progress Notes (Signed)
Remote pacemaker transmission.   

## 2022-09-13 DIAGNOSIS — I5022 Chronic systolic (congestive) heart failure: Secondary | ICD-10-CM | POA: Diagnosis not present

## 2022-09-13 DIAGNOSIS — I110 Hypertensive heart disease with heart failure: Secondary | ICD-10-CM | POA: Diagnosis not present

## 2022-09-15 ENCOUNTER — Telehealth: Payer: Self-pay | Admitting: Cardiology

## 2022-09-15 NOTE — Telephone Encounter (Signed)
LVM for pt to call back/kbl 09/15/22

## 2022-09-15 NOTE — Telephone Encounter (Signed)
Patient is calling to see if she can give her appt time and slot up to give to her daughter (MRN: [id number]) who would be a new patient so that her daughter can be seen sooner. Requesting return call.

## 2022-09-15 NOTE — Telephone Encounter (Signed)
Spoke with pt who would like her daughter to be seen as a new pt in her upcoming appointment. Pt states her daughter was seen in the ED at [facility] yesterday and they recommend a cardiology FU but the soonest appointment was 10/2022. Please advise.

## 2022-09-17 NOTE — Telephone Encounter (Signed)
Patient moved to NP Woody's schedule, daughter's appt moved to [facility] schedule on the same day.  Patient and daughter happy, and aware.  Thanks

## 2022-09-27 NOTE — Progress Notes (Unsigned)
Cardiology Office Note:  .   Date:  09/28/2022  ID:  Lindsey Murphy, DOB 02-07-1956, MRN [id number] PCP: Everlean Cherry, MD  [facility] Providers Cardiologist:  None Electrophysiologist:  Will Jorja Loa, MD    History of Present Illness: Lindsey Murphy is a 67 y.o. female with a past medical history of diastolic heart failure, PVCs, ischemic cardiomyopathy, bradycardia with SSS s/p PPM, nonobstructive CAD, PVD, OSA on CPAP, hyperlipidemia.   07/27/2022 recent device check was normal 09/09/2020 echo EF 45 to 50%, grade 2 DD Coronary CTA at [facility] 07/30/2016 revealed a calcium score of 11, mild nonobstructive CAD 06/25/2016 left heart cath at [facility], normal coronary arteries, felt to be vasospasm  Recently evaluated by Dr. Dulce Sellar on 04/22/2022, she was doing well at this time, recent device interrogation per EP revealed an 8% burden and she had been started on mexiletine.  She was tolerating this well.  She presents today for routine follow-up.  She offers no complaints.  She stays relatively busy caring for her mother who suffers with dementia.  She has had a few episodes where she feels like her blood pressure may have dropped lower and had some dizziness as well, this is only occurred once every 2 to 3 months.  She does have ongoing pedal edema however she is compliant with her compression socks.  She tries to stay hydrated with mostly water. She denies chest pain, palpitations, dyspnea, pnd, orthopnea, n, v, dizziness, syncope, edema, weight gain, or early satiety.  ROS: Review of Systems  Constitutional: Negative.   HENT: Negative.    Eyes: Negative.   Respiratory: Negative.    Cardiovascular: Negative.   Gastrointestinal: Negative.   Genitourinary: Negative.   Musculoskeletal: Negative.   Skin: Negative.   Neurological:  Positive for dizziness.  Endo/Heme/Allergies: Negative.   Psychiatric/Behavioral: Negative.       Studies Reviewed: .         Cardiac Studies & Procedures     STRESS TESTS  EXERCISE TOLERANCE TEST (ETT) 02/02/2017  Narrative  Blood pressure demonstrated a hypertensive response to exercise.  There was no ST segment deviation noted during stress.  Normal ETT No ischemia HTN response to exercise   ECHOCARDIOGRAM  ECHOCARDIOGRAM COMPLETE 09/09/2020  Narrative ECHOCARDIOGRAM REPORT    Patient Name:   Lindsey Murphy Date of Exam: 09/09/2020 Medical Rec #:  [id number]        Height:       64.0 in Accession #:    [id number]       Weight:       242.2 lb Date of Birth:  01/21/1956         BSA:          2.122 m Patient Age:    65 years         BP:           110/70 mmHg Patient Gender: F                HR:           69 bpm. Exam Location:  [place]  Procedure: 2D Echo  Indications:    Congenital anomaly of coronary artery [Q24.5 (ICD-10-CM)]; Cardiac microvascular disease [I25.89 (ICD-10-CM)]; Sick sinus syndrome (HCC) [I49.5 (ICD-10-CM)]; Pacemaker [Z95.0 (ICD-10-CM)]; PVC's (premature ventricular contractions) [I49.3 (ICD-10-CM)]; Mixed hyperlipidemia [E78.2 (ICD-10-CM)]; Chronic combined systolic and diastolic congestive heart failure (HCC) [I50.42 (ICD-10-CM)]  History:        Patient has prior history  of Echocardiogram examinations, most recent 12/29/2015.  Sonographer:    Louie Boston Referring Phys: [phone] BRIAN J MUNLEY  IMPRESSIONS   1. Left ventricular ejection fraction, by estimation, is 45 to 50%. The left ventricle has mildly decreased function. The left ventricle has no regional wall motion abnormalities. The left ventricular internal cavity size was moderately dilated. Left ventricular diastolic parameters are consistent with Grade II diastolic dysfunction (pseudonormalization). The average left ventricular global longitudinal strain is -13.1 %. The global longitudinal strain is abnormal. 2. Right ventricular systolic function is normal. The right ventricular size is normal. There is  normal pulmonary artery systolic pressure. 3. Moderate eccentric posterioly directed mitral valve regurgitation. No evidence of mitral stenosis. 4. The aortic valve is normal in structure. Aortic valve regurgitation is not visualized. No aortic stenosis is present. 5. The inferior vena cava is normal in size with greater than 50% respiratory variability, suggesting right atrial pressure of 3 mmHg.  FINDINGS Left Ventricle: Left ventricular ejection fraction, by estimation, is 45 to 50%. The left ventricle has mildly decreased function. The left ventricle has no regional wall motion abnormalities. The average left ventricular global longitudinal strain is -13.1 %. The global longitudinal strain is abnormal. The left ventricular internal cavity size was moderately dilated. There is no left ventricular hypertrophy. Left ventricular diastolic parameters are consistent with Grade II diastolic dysfunction (pseudonormalization).  Right Ventricle: The right ventricular size is normal. No increase in right ventricular wall thickness. Right ventricular systolic function is normal. There is normal pulmonary artery systolic pressure. The tricuspid regurgitant velocity is 2.32 m/s, and with an assumed right atrial pressure of 3 mmHg, the estimated right ventricular systolic pressure is 24.5 mmHg.  Left Atrium: Left atrial size was normal in size.  Right Atrium: Right atrial size was normal in size.  Pericardium: There is no evidence of pericardial effusion.  Mitral Valve: The mitral valve is normal in structure. Moderate mitral valve regurgitation, with eccentric posteriorly directed jet. No evidence of mitral valve stenosis.  Tricuspid Valve: The tricuspid valve is normal in structure. Tricuspid valve regurgitation is trivial. No evidence of tricuspid stenosis.  Aortic Valve: The aortic valve is normal in structure. Aortic valve regurgitation is not visualized. Aortic regurgitation PHT measures 686 msec.  No aortic stenosis is present.  Pulmonic Valve: The pulmonic valve was normal in structure. Pulmonic valve regurgitation is trivial. No evidence of pulmonic stenosis.  Aorta: The aortic root is normal in size and structure.  Venous: The inferior vena cava is normal in size with greater than 50% respiratory variability, suggesting right atrial pressure of 3 mmHg.  IAS/Shunts: No atrial level shunt detected by color flow Doppler.  Additional Comments: A device lead is visualized.   LEFT VENTRICLE PLAX 2D LVIDd:         5.80 cm      Diastology LVIDs:         4.40 cm      LV e' medial:    6.20 cm/s LV PW:         1.00 cm      LV E/e' medial:  18.1 LV IVS:        1.00 cm      LV e' lateral:   6.74 cm/s LVOT diam:     2.00 cm      LV E/e' lateral: 16.6 LV SV:         68 LV SV Index:   32  2D Longitudinal Strain LVOT Area:     3.14 cm     2D Strain GLS Avg:     -13.1 %  LV Volumes (MOD) LV vol d, MOD A2C: 97.5 ml LV vol d, MOD A4C: 106.0 ml LV vol s, MOD A2C: 49.3 ml LV vol s, MOD A4C: 52.3 ml LV SV MOD A2C:     48.2 ml LV SV MOD A4C:     106.0 ml LV SV MOD BP:      51.9 ml  RIGHT VENTRICLE             IVC RV S prime:     12.40 cm/s  IVC diam: 1.40 cm TAPSE (M-mode): 3.1 cm  LEFT ATRIUM             Index       RIGHT ATRIUM           Index LA diam:        3.80 cm 1.79 cm/m  RA Area:     14.60 cm LA Vol (A2C):   61.8 ml 29.12 ml/m RA Volume:   38.00 ml  17.91 ml/m LA Vol (A4C):   55.9 ml 26.34 ml/m LA Biplane Vol: 61.7 ml 29.07 ml/m AORTIC VALVE LVOT Vmax:   87.20 cm/s LVOT Vmean:  57.300 cm/s LVOT VTI:    0.216 m AI PHT:      686 msec  AORTA Ao Root diam: 3.20 cm Ao Asc diam:  3.00 cm Ao Desc diam: 2.00 cm  MITRAL VALVE                 TRICUSPID VALVE MV Area (PHT): 3.26 cm      TR Peak grad:   21.5 mmHg MV Decel Time: 233 msec      TR Vmax:        232.00 cm/s MR Peak grad:    98.0 mmHg MR Mean grad:    64.0 mmHg   SHUNTS MR Vmax:         495.00 cm/s  Systemic VTI:  0.22 m MR Vmean:        370.0 cm/s  Systemic Diam: 2.00 cm MR PISA:         1.01 cm MR PISA Eff ROA: 8 mm MR PISA Radius:  0.40 cm MV E velocity: 112.00 cm/s MV A velocity: 100.00 cm/s MV E/A ratio:  1.12  Kardie Tobb DO Electronically signed by Thomasene Ripple DO Signature Date/Time: 09/09/2020/1:00:12 PM    Final             Risk Assessment/Calculations:             Physical Exam:   VS:  BP 124/84 (BP Location: Right Arm, Patient Position: Sitting, Cuff Size: Normal)   Pulse 80   Ht 5\' 4"  (1.626 m)   Wt 232 lb 3.2 oz (105.3 kg)   SpO2 95%   BMI 39.86 kg/m    Wt Readings from Last 3 Encounters:  09/28/22 232 lb 3.2 oz (105.3 kg)  04/22/22 230 lb (104.3 kg)  04/19/22 232 lb (105.2 kg)    GEN: Well nourished, well developed in no acute distress NECK: No JVD; No carotid bruits CARDIAC: RRR, no murmurs, rubs, gallops RESPIRATORY:  Clear to auscultation without rales, wheezing or rhonchi  ABDOMEN: Soft, non-tender, non-distended EXTREMITIES: Trace edema; No deformity   ASSESSMENT AND PLAN: .   Coronary artery disease-mild nonobstructive per coronary CTA in 2018. Stable with no anginal symptoms. No indication for  ischemic evaluation.  Continue aspirin 81 mg daily, continue Lipitor 10 mg daily, continue Norvasc 2.5 mg daily, continue metoprolol 50 mg twice daily, continue nitroglycerin as needed. Ischemic cardiomyopathy/HFmrEF-echo in 2022 EF 45 to 50%, grade 2 DD.  NYHA class I, euvolemic.  Continue metoprolol 50 mg twice daily.  Continue Lasix 20 mg twice daily.  Consider further GDMT at future appointments. Sick sinus syndrome/presence of PPM-most recent device interrogation showed normal functioning PPM.  Has recall to follow-up with the EP around September.   PVCs -noted on device interrogation as high as 8%, was started on mexiletine and she has not noticed further palpitations. Hyperlipidemia-most recent LDL was well-controlled at 68, this is being followed  by her PCP, continue Lipitor 10 mg daily.        Dispo: Follow-up in 6 months.  Signed, Flossie Dibble, NP

## 2022-09-28 ENCOUNTER — Encounter: Payer: Self-pay | Admitting: Cardiology

## 2022-09-28 ENCOUNTER — Ambulatory Visit: Payer: Medicare Other | Attending: Cardiology | Admitting: Cardiology

## 2022-09-28 ENCOUNTER — Ambulatory Visit: Payer: Medicare Other | Admitting: Cardiology

## 2022-09-28 VITALS — BP 124/84 | HR 80 | Ht 64.0 in | Wt 232.2 lb

## 2022-09-28 DIAGNOSIS — I25118 Atherosclerotic heart disease of native coronary artery with other forms of angina pectoris: Secondary | ICD-10-CM

## 2022-09-28 DIAGNOSIS — I495 Sick sinus syndrome: Secondary | ICD-10-CM

## 2022-09-28 DIAGNOSIS — I5022 Chronic systolic (congestive) heart failure: Secondary | ICD-10-CM | POA: Diagnosis not present

## 2022-09-28 DIAGNOSIS — R001 Bradycardia, unspecified: Secondary | ICD-10-CM

## 2022-09-28 DIAGNOSIS — I5020 Unspecified systolic (congestive) heart failure: Secondary | ICD-10-CM

## 2022-09-28 DIAGNOSIS — I493 Ventricular premature depolarization: Secondary | ICD-10-CM

## 2022-09-28 NOTE — Patient Instructions (Signed)
Medication Instructions:   *If you need a refill on your cardiac medications before your next appointment, please call your pharmacy*   Lab Work: If you have labs (blood work) drawn today and your tests are completely normal, you will receive your results only by: MyChart Message (if you have MyChart) OR A paper copy in the mail If you have any lab test that is abnormal or we need to change your treatment, we will call you to review the results.   Testing/Procedures:    Follow-Up: At [facility], you and your health needs are our priority.  As part of our continuing mission to provide you with exceptional heart care, we have created designated Provider Care Teams.  These Care Teams include your primary Cardiologist (physician) and Advanced Practice Providers (APPs -  Physician Assistants and Nurse Practitioners) who all work together to provide you with the care you need, when you need it.  We recommend signing up for the patient portal called "MyChart".  Sign up information is provided on this After Visit Summary.  MyChart is used to connect with patients for Virtual Visits (Telemedicine).  Patients are able to view lab/test results, encounter notes, upcoming appointments, etc.  Non-urgent messages can be sent to your provider as well.   To learn more about what you can do with MyChart, go to [email or web address].    Your next appointment:   6 month(s)  Provider:   Norman Herrlich, MD    Other Instructions

## 2022-09-29 DIAGNOSIS — G4733 Obstructive sleep apnea (adult) (pediatric): Secondary | ICD-10-CM | POA: Diagnosis not present

## 2022-10-26 ENCOUNTER — Ambulatory Visit (INDEPENDENT_AMBULATORY_CARE_PROVIDER_SITE_OTHER): Payer: Medicare Other

## 2022-10-26 DIAGNOSIS — I495 Sick sinus syndrome: Secondary | ICD-10-CM | POA: Diagnosis not present

## 2022-11-07 DIAGNOSIS — R040 Epistaxis: Secondary | ICD-10-CM | POA: Diagnosis not present

## 2022-11-08 ENCOUNTER — Other Ambulatory Visit: Payer: Self-pay

## 2022-11-08 ENCOUNTER — Telehealth: Payer: Self-pay | Admitting: Cardiology

## 2022-11-08 DIAGNOSIS — R040 Epistaxis: Secondary | ICD-10-CM | POA: Diagnosis not present

## 2022-11-08 NOTE — Telephone Encounter (Signed)
Pt c/o medication issue:  1. Name of Medication: aspirin EC 81 MG tablet   2. How are you currently taking this medication (dosage and times per day)? Take 1 tablet (81 mg total) by mouth daily. Swallow whole.   3. Are you having a reaction (difficulty breathing--STAT)? No  4. What is your medication issue? Patient is calling because she has been having a nose bleed since last week. Patient's PCP requested she stopped the medication last Tuesday. Patient stated she stopped the medication from Tuesday-Friday last week. Patient took the medication on Saturday by accident. Patient stated her nose bleed start back and she wants to know how long can she go without taking Aspirin. Please advise.

## 2022-11-08 NOTE — Telephone Encounter (Signed)
Called patient and she reported having multiple nose bleeds per day for the past week. When she has the nose bleeds it is bright red blood with clots the size of quarters to silver dollars. She went to her PCP last Tuesday and she discontinued her Afrin nasal spray and aspirin. Since then when she bends over or accidentally takes a baby aspirin from her pill box she starts bleeding from her nose again. She went to the ER twice yesterday and currently has a nasal foam trumpet in her nose. Her questions is how long can she safely be off the aspirin?

## 2022-11-10 NOTE — Telephone Encounter (Signed)
Called patient and informed her of Dr. Hulen Shouts recommendation below:  "I agree to hold aspirin at least 3 to 5 days until her nosebleed stopped but she also needs to go see an ENT typically this is a problem inside the nose and the solution is cautery."  Patient stated that she already had an appointment with Dr. Pollyann Kennedy, an ENT physician in [place] for this Friday. Patient was appreciative for the call and had no further questions at this time.

## 2022-11-10 NOTE — Telephone Encounter (Signed)
Left message for the patient to call back.

## 2022-11-10 NOTE — Progress Notes (Signed)
Remote pacemaker transmission.   

## 2022-11-12 DIAGNOSIS — R040 Epistaxis: Secondary | ICD-10-CM | POA: Diagnosis not present

## 2022-11-16 ENCOUNTER — Telehealth: Payer: Self-pay

## 2022-11-16 NOTE — Telephone Encounter (Signed)
RV lead impedance 1697 ohms, reviewed with Dr.Camnitz, patient does not VP, threshold and sensing stable, continue to monitor

## 2022-12-06 ENCOUNTER — Telehealth: Payer: Self-pay

## 2022-12-06 NOTE — Telephone Encounter (Signed)
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  Dr. Elberta Fortis had previously reviewed this finding.  Pt does not pace in the RV.  Sent message to scheduling to schedule next available in [place].

## 2022-12-13 ENCOUNTER — Telehealth: Payer: Self-pay

## 2022-12-13 NOTE — Telephone Encounter (Signed)
Open in error

## 2022-12-20 NOTE — Telephone Encounter (Signed)
Outreach made to Pt.  Left message requesting call back.  Scheduled with Dr. Elberta Fortis in [facility] December 27, 2022 at 8:45 am.  Pt needs to be advised of appt.

## 2022-12-20 NOTE — Telephone Encounter (Signed)
Call back received from Pt.  She is aware of appt.  She will call back if she cannot find a sitter for her mother.

## 2022-12-27 ENCOUNTER — Ambulatory Visit: Payer: Medicare Other | Attending: Cardiology | Admitting: Cardiology

## 2022-12-27 ENCOUNTER — Encounter: Payer: Self-pay | Admitting: Cardiology

## 2022-12-27 VITALS — BP 140/76 | HR 66 | Ht 64.0 in | Wt 228.0 lb

## 2022-12-27 DIAGNOSIS — I493 Ventricular premature depolarization: Secondary | ICD-10-CM | POA: Diagnosis not present

## 2022-12-27 DIAGNOSIS — I5022 Chronic systolic (congestive) heart failure: Secondary | ICD-10-CM | POA: Diagnosis not present

## 2022-12-27 DIAGNOSIS — R001 Bradycardia, unspecified: Secondary | ICD-10-CM

## 2022-12-27 LAB — CUP PACEART INCLINIC DEVICE CHECK
Date Time Interrogation Session: 20241014092134
Implantable Lead Connection Status: 753985
Implantable Lead Connection Status: 753985
Implantable Lead Implant Date: 20171205
Implantable Lead Implant Date: 20171205
Implantable Lead Location: 753859
Implantable Lead Location: 753860
Implantable Lead Model: 377
Implantable Lead Model: 377
Implantable Lead Serial Number: 49684101
Implantable Lead Serial Number: 49719338
Implantable Pulse Generator Implant Date: 20171205
Pulse Gen Model: 407145
Pulse Gen Serial Number: 68906976

## 2022-12-27 NOTE — Patient Instructions (Signed)
Medication Instructions:  Your physician recommends that you continue on your current medications as directed. Please refer to the Current Medication list given to you today.  *If you need a refill on your cardiac medications before your next appointment, please call your pharmacy*   Lab Work: None ordered   Testing/Procedures: None ordered   Follow-Up: At [facility], you and your health needs are our priority.  As part of our continuing mission to provide you with exceptional heart care, we have created designated Provider Care Teams.  These Care Teams include your primary Cardiologist (physician) and Advanced Practice Providers (APPs -  Physician Assistants and Nurse Practitioners) who all work together to provide you with the care you need, when you need it.  Remote monitoring is used to monitor your Pacemaker or ICD from home. This monitoring reduces the number of office visits required to check your device to one time per year. It allows [place] to keep an eye on the functioning of your device to ensure it is working properly. You are scheduled for a device check from home on 03/21/23. You may send your transmission at any time that day. If you have a wireless device, the transmission will be sent automatically. After your physician reviews your transmission, you will receive a postcard with your next transmission date.  Your next appointment:   1 year(s)  The format for your next appointment:   In Person  Provider:   Loman Brooklyn, MD    Thank you for choosing [facility]!!   Dory Horn, RN [phone]

## 2022-12-27 NOTE — Progress Notes (Signed)
  Electrophysiology Office Note:   Date:  12/27/2022  ID:  [name], DOB 1955/11/25, MRN [id number]  Primary Cardiologist: Norman Herrlich, MD Electrophysiologist: Regan Lemming, MD      History of Present Illness:   Lindsey Murphy is a 67 y.o. female with h/o diastolic heart failure, congenital coronary anomaly, hyperlipidemia, symptomatic bradycardia post Biotronik pacemaker, PVCs seen today for routine electrophysiology followup.   Since last being seen in our clinic the patient reports doing overall well.  She has no chest pain or shortness of breath.  She is able to do all her daily activities without restriction.  Blood pressure is mildly elevated today, but she attributes this to not sleeping well overnight.  She states her blood pressure is usually quite well-controlled.  she denies chest pain, palpitations, dyspnea, PND, orthopnea, nausea, vomiting, dizziness, syncope, edema, weight gain, or early satiety.   Review of systems complete and found to be negative unless listed in HPI.      EP Information / Studies Reviewed:    EKG is ordered today. Personal review as below.  EKG Interpretation Date/Time:  Monday December 27 2022 08:57:49 EDT Ventricular Rate:  66 PR Interval:  202 QRS Duration:  100 QT Interval:  396 QTC Calculation: 415 R Axis:   -39  Text Interpretation: Normal sinus rhythm Left axis deviation When compared with ECG of 24-Feb-1999 12:44, QRS axis Shifted left Confirmed by Danford Tat ([id number]) on 12/27/2022 9:09:47 AM   PPM Interrogation-  reviewed in detail today,  See PACEART report.  Device History: Biotronik Single Chamber PPM implanted for Symptomatic bradycardia  Risk Assessment/Calculations:             Physical Exam:   VS:  BP (!) 140/76   Pulse 66   Ht 5\' 4"  (1.626 m)   Wt 228 lb (103.4 kg)   SpO2 99%   BMI 39.14 kg/m    Wt Readings from Last 3 Encounters:  12/27/22 228 lb (103.4 kg)  09/28/22 232 lb 3.2 oz (105.3 kg)   04/22/22 230 lb (104.3 kg)     GEN: Well nourished, well developed in no acute distress NECK: No JVD; No carotid bruits CARDIAC: Regular rate and rhythm, no murmurs, rubs, gallops RESPIRATORY:  Clear to auscultation without rales, wheezing or rhonchi  ABDOMEN: Soft, non-tender, non-distended EXTREMITIES:  No edema; No deformity   ASSESSMENT AND PLAN:    Symptomatic bradycardia s/p Biotronik PPM  Normal PPM function See Pace Art report No changes today Her RV lead is fractured.  She rarely paces.  Lindsey Murphy continue to monitor.  She Lindsey Murphy likely require lead extraction at her next generator change.  2.  PVCs: Currently on mexiletine.  Minimal noted  3.  Congenital coronary anomaly: Currently on Plavix, atorvastatin, Toprol-XL.  Plan per primary cardiology.  4.  Mild systolic and diastolic heart failure: No evidence of volume overload  5.  Hyperlipidemia: Continue atorvastatin per primary cardiology  Disposition:   Follow up with Dr. Elberta Fortis in 12 months  Signed, Taz Vanness Jorja Loa, MD

## 2023-01-25 ENCOUNTER — Ambulatory Visit (INDEPENDENT_AMBULATORY_CARE_PROVIDER_SITE_OTHER): Payer: Medicare Other

## 2023-01-25 DIAGNOSIS — I495 Sick sinus syndrome: Secondary | ICD-10-CM | POA: Diagnosis not present

## 2023-01-26 LAB — CUP PACEART REMOTE DEVICE CHECK
Date Time Interrogation Session: 20241112083843
Implantable Lead Connection Status: 753985
Implantable Lead Connection Status: 753985
Implantable Lead Implant Date: 20171205
Implantable Lead Implant Date: 20171205
Implantable Lead Location: 753859
Implantable Lead Location: 753860
Implantable Lead Model: 377
Implantable Lead Model: 377
Implantable Lead Serial Number: 49684101
Implantable Lead Serial Number: 49719338
Implantable Pulse Generator Implant Date: 20171205
Pulse Gen Model: 407145
Pulse Gen Serial Number: 68906976

## 2023-02-21 NOTE — Progress Notes (Signed)
Remote pacemaker transmission.   

## 2023-02-25 DIAGNOSIS — M19011 Primary osteoarthritis, right shoulder: Secondary | ICD-10-CM | POA: Diagnosis not present

## 2023-02-25 DIAGNOSIS — M25511 Pain in right shoulder: Secondary | ICD-10-CM | POA: Diagnosis not present

## 2023-03-21 ENCOUNTER — Encounter: Payer: Medicare Other | Admitting: Cardiology

## 2023-04-20 DIAGNOSIS — R0982 Postnasal drip: Secondary | ICD-10-CM | POA: Diagnosis not present

## 2023-04-20 DIAGNOSIS — H10023 Other mucopurulent conjunctivitis, bilateral: Secondary | ICD-10-CM | POA: Diagnosis not present

## 2023-04-20 DIAGNOSIS — R0981 Nasal congestion: Secondary | ICD-10-CM | POA: Diagnosis not present

## 2023-04-26 ENCOUNTER — Ambulatory Visit (INDEPENDENT_AMBULATORY_CARE_PROVIDER_SITE_OTHER): Payer: Medicare Other

## 2023-04-26 DIAGNOSIS — I495 Sick sinus syndrome: Secondary | ICD-10-CM

## 2023-04-28 LAB — CUP PACEART REMOTE DEVICE CHECK
Date Time Interrogation Session: 20250212095750
Implantable Lead Connection Status: 753985
Implantable Lead Connection Status: 753985
Implantable Lead Implant Date: 20171205
Implantable Lead Implant Date: 20171205
Implantable Lead Location: 753859
Implantable Lead Location: 753860
Implantable Lead Model: 377
Implantable Lead Model: 377
Implantable Lead Serial Number: 49684101
Implantable Lead Serial Number: 49719338
Implantable Pulse Generator Implant Date: 20171205
Pulse Gen Model: 407145
Pulse Gen Serial Number: 68906976

## 2023-05-11 ENCOUNTER — Ambulatory Visit: Payer: Medicare Other | Admitting: Cardiology

## 2023-05-11 ENCOUNTER — Encounter: Payer: Self-pay | Admitting: Cardiology

## 2023-05-11 NOTE — Progress Notes (Deleted)
 Cardiology Office Note:    Date:  05/11/2023   ID:  Lindsey Murphy, DOB 11-08-1955, MRN [id number]  PCP:  Everlean Cherry, MD  Cardiologist:  Norman Herrlich, MD    Referring MD: Everlean Cherry, MD    ASSESSMENT:    1. Congenital anomaly of coronary artery   2. Coronary artery disease of native artery of native heart with stable angina pectoris (HCC)   3. Heart failure with mildly reduced ejection fraction (HFmrEF) (HCC)   4. Sick sinus syndrome (HCC)   5. Pacemaker   6. PVC's (premature ventricular contractions)   7. High risk medication use   8. Mixed hyperlipidemia    PLAN:    In order of problems listed above:  ***   Next appointment: ***   Medication Adjustments/Labs and Tests Ordered: Current medicines are reviewed at length with the patient today.  Concerns regarding medicines are outlined above.  No orders of the defined types were placed in this encounter.  No orders of the defined types were placed in this encounter.    History of Present Illness:    Lindsey Murphy is a 68 y.o. 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  Compliance with diet, lifestyle and medications: ***  Recent labs 03/23/2023: Hgb 13.6 Platelets mildly reduced 126000 A!C 5.3% Na 141 K 4.0 Cr 0.8 mildly abnormal Abnormal Alk Phos and  112 AST 68 Past Medical History:  Diagnosis Date   Acute cystitis 06/25/2016   Arthritis    Asthma with hay fever, mild intermittent, uncomplicated 05/31/2017   Bell's palsy 08/07/2019   Bile reflux esophagitis 04/29/2015   BMI 40.0-44.9, adult (HCC) 08/23/2016   Cellulitis of lower limb 05/24/2017   Chest pain 06/25/2016   Chronic  diastolic congestive heart failure (HCC) 07/06/2016   Congenital anomaly of coronary artery 11/21/2014   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 Overview:  LCFx, benign variant Formatting of this note might be different from the original. LCFx, benign variant   Coronary artery disease of native artery of native heart with stable angina pectoris (HCC) 04/29/2015   Drug therapy 04/27/2015   Frequent PVCs 10/05/2015   Overview:  With normal LV EF% on a beta blocker  Formatting of this note might be different from the original. With normal LV EF% on a beta blocker   GERD (gastroesophageal reflux disease)    Hay fever 11/24/2015   Heart murmur    High risk medication use 04/27/2017   Flecanide   History of hip replacement 12/17/2014   History of repair of hip joint 12/17/2014   Hx of Bell's palsy    Hyperlipidemia 04/27/2015   Hypertension    Hypertensive heart disease with chronic systolic congestive heart failure (HCC) 07/22/2016   IBS (irritable bowel syndrome) 04/29/2015   Ischemic cardiomyopathy 09/15/2016   Cath with nonSTEMI 06/25/2016 Conclusions:  Normal coronary arteries. Anomalous dominant circ from R sinus Mild inferior segmental LV systolic dysfunction  CTA 07/21/16 ejection fraction 46%  Formatting of this note might be different from the original. Overview:  Cath with nonSTEMI 06/25/2016 Conclusions:  Normal coronary arteries. Anomalous dominant circ from R sinus Mild  inferior segmental LV sys   LFT elevation 02/28/2017   LVH (left ventricular hypertrophy) 04/29/2015   Migraine 04/29/2015   Mild CAD 11/21/2014   Myocardial infarction [facility])    12/2012 - vasospasms    NSTEMI (non-ST elevated myocardial infarction) (HCC) 06/25/2016   Old MI (myocardial infarction) 04/27/2015   NSTEMI 2014 and recent NSTEMI 06/25/16      Formatting of this note might be different from the original. Overview:  NSTEMI  2014 and recent NSTEMI 06/25/16   OSA on CPAP 04/29/2015   Osteoarthritis 04/29/2015   Pacemaker 02/18/2016   Dual chamber BIOTRONIC [place]        Pacemaker lead failure 02/23/2016   Pacemaker reprogramming/check 02/17/2016   Pain in left knee 05/04/2017   Pain in right foot 05/04/2017   Peripheral vascular disease (HCC)    Pink eye disease of left eye    diagnosed 12/06/2014    Pneumonia    hx of pneumonia    Preoperative cardiovascular examination 11/21/2014   Psoriasis 05/18/2015   Psoriatic arthritis (HCC) 04/29/2015   S/P left THA, AA 12/17/2014   Sciatica, right side 04/29/2015   Seasonal allergies 08/07/2019   Severe sinus bradycardia 09/15/2016   Sick sinus syndrome (HCC) 02/18/2016   Sinus bradycardia 01/01/2016   Sleep apnea    cpap- does not know settings   Thrombophlebitis of superficial veins of left lower extremity 02/26/2019   Thyroid nodule 03/16/2017   TSH elevation 09/08/2018   Varicose veins of bilateral lower extremities with pain 03/20/2019   Venous insufficiency 04/29/2015   Vitamin D deficiency 04/29/2015    Current Medications: No outpatient medications have been marked as taking for the 05/11/23 encounter (Appointment) with Baldo Daub, MD.      EKGs/Labs/Other Studies Reviewed:    The following studies were reviewed today:     EKG 12/27/2022 by EP [facility] first degree AVB and LAD Recent Labs: No results found for requested labs within last 365 days.  Recent Lipid Panel    Component Value Date/Time   CHOL 136 03/24/2021 1132   TRIG 137 03/24/2021 1132   HDL 44 03/24/2021 1132   CHOLHDL 3.1 03/24/2021 1132   LDLCALC 68 03/24/2021 1132    Physical Exam:    VS:  There were no vitals taken for this visit.    Wt Readings from Last 3 Encounters:  12/27/22 228 lb (103.4 kg)  09/28/22 232 lb 3.2 oz (105.3 kg)  04/22/22 230 lb (104.3 kg)     GEN: *** Well nourished, well developed in no acute distress HEENT: Normal NECK: No JVD; No carotid bruits LYMPHATICS:  No lymphadenopathy CARDIAC: ***RRR, no murmurs, rubs, gallops RESPIRATORY:  Clear to auscultation without rales, wheezing or rhonchi  ABDOMEN: Soft, non-tender, non-distended MUSCULOSKELETAL:  No edema; No deformity  SKIN: Warm and dry NEUROLOGIC:  Alert and oriented x 3 PSYCHIATRIC:  Normal affect    Signed, Norman Herrlich, MD  05/11/2023 7:43 AM    [facility]

## 2023-05-29 NOTE — Progress Notes (Signed)
 This encounter was created in error - please disregard.

## 2023-05-29 NOTE — Patient Instructions (Signed)
 Error

## 2023-06-02 NOTE — Progress Notes (Signed)
 Remote pacemaker transmission.

## 2023-06-02 NOTE — Addendum Note (Signed)
 Addended by: Elease Etienne A on: 06/02/2023 04:14 PM   Modules accepted: Orders

## 2023-06-28 ENCOUNTER — Ambulatory Visit: Payer: Medicare Other | Admitting: Cardiology

## 2023-06-29 ENCOUNTER — Ambulatory Visit: Payer: Medicare Other | Admitting: Cardiology

## 2023-07-26 ENCOUNTER — Ambulatory Visit (INDEPENDENT_AMBULATORY_CARE_PROVIDER_SITE_OTHER): Payer: Medicare Other

## 2023-07-26 DIAGNOSIS — I495 Sick sinus syndrome: Secondary | ICD-10-CM | POA: Diagnosis not present

## 2023-07-28 LAB — CUP PACEART REMOTE DEVICE CHECK
Date Time Interrogation Session: 20250513084016
Implantable Lead Connection Status: 753985
Implantable Lead Connection Status: 753985
Implantable Lead Implant Date: 20171205
Implantable Lead Implant Date: 20171205
Implantable Lead Location: 753859
Implantable Lead Location: 753860
Implantable Lead Model: 377
Implantable Lead Model: 377
Implantable Lead Serial Number: 49684101
Implantable Lead Serial Number: 49719338
Implantable Pulse Generator Implant Date: 20171205
Pulse Gen Model: 407145
Pulse Gen Serial Number: 68906976

## 2023-08-01 ENCOUNTER — Ambulatory Visit: Payer: Self-pay | Admitting: Cardiology

## 2023-09-07 ENCOUNTER — Ambulatory Visit: Admitting: Cardiology

## 2023-09-08 NOTE — Progress Notes (Signed)
 Remote pacemaker transmission.

## 2023-09-08 NOTE — Addendum Note (Signed)
 Addended by: VICCI SELLER A on: 09/08/2023 09:02 AM   Modules accepted: Orders

## 2023-09-15 DIAGNOSIS — H04301 Unspecified dacryocystitis of right lacrimal passage: Secondary | ICD-10-CM | POA: Diagnosis not present

## 2023-09-17 DIAGNOSIS — Z950 Presence of cardiac pacemaker: Secondary | ICD-10-CM | POA: Diagnosis not present

## 2023-09-17 DIAGNOSIS — H05011 Cellulitis of right orbit: Secondary | ICD-10-CM | POA: Diagnosis not present

## 2023-09-17 DIAGNOSIS — E669 Obesity, unspecified: Secondary | ICD-10-CM | POA: Diagnosis not present

## 2023-09-17 DIAGNOSIS — Z6838 Body mass index (BMI) 38.0-38.9, adult: Secondary | ICD-10-CM | POA: Diagnosis not present

## 2023-09-17 DIAGNOSIS — H02843 Edema of right eye, unspecified eyelid: Secondary | ICD-10-CM | POA: Diagnosis not present

## 2023-09-17 DIAGNOSIS — Z880 Allergy status to penicillin: Secondary | ICD-10-CM | POA: Diagnosis not present

## 2023-09-17 DIAGNOSIS — L03213 Periorbital cellulitis: Secondary | ICD-10-CM | POA: Diagnosis not present

## 2023-09-17 DIAGNOSIS — I2510 Atherosclerotic heart disease of native coronary artery without angina pectoris: Secondary | ICD-10-CM | POA: Diagnosis not present

## 2023-09-17 DIAGNOSIS — I5032 Chronic diastolic (congestive) heart failure: Secondary | ICD-10-CM | POA: Diagnosis not present

## 2023-09-17 DIAGNOSIS — H16001 Unspecified corneal ulcer, right eye: Secondary | ICD-10-CM | POA: Diagnosis not present

## 2023-09-17 DIAGNOSIS — Z96641 Presence of right artificial hip joint: Secondary | ICD-10-CM | POA: Diagnosis not present

## 2023-09-17 DIAGNOSIS — I110 Hypertensive heart disease with heart failure: Secondary | ICD-10-CM | POA: Diagnosis not present

## 2023-09-17 DIAGNOSIS — Z888 Allergy status to other drugs, medicaments and biological substances status: Secondary | ICD-10-CM | POA: Diagnosis not present

## 2023-09-17 DIAGNOSIS — H04551 Acquired stenosis of right nasolacrimal duct: Secondary | ICD-10-CM | POA: Diagnosis not present

## 2023-09-17 DIAGNOSIS — Q245 Malformation of coronary vessels: Secondary | ICD-10-CM | POA: Diagnosis not present

## 2023-09-17 DIAGNOSIS — E785 Hyperlipidemia, unspecified: Secondary | ICD-10-CM | POA: Diagnosis not present

## 2023-09-17 DIAGNOSIS — Z833 Family history of diabetes mellitus: Secondary | ICD-10-CM | POA: Diagnosis not present

## 2023-09-17 DIAGNOSIS — K219 Gastro-esophageal reflux disease without esophagitis: Secondary | ICD-10-CM | POA: Diagnosis not present

## 2023-09-18 DIAGNOSIS — H16001 Unspecified corneal ulcer, right eye: Secondary | ICD-10-CM | POA: Diagnosis not present

## 2023-09-18 DIAGNOSIS — L03213 Periorbital cellulitis: Secondary | ICD-10-CM | POA: Diagnosis not present

## 2023-09-18 DIAGNOSIS — I110 Hypertensive heart disease with heart failure: Secondary | ICD-10-CM | POA: Diagnosis not present

## 2023-09-18 DIAGNOSIS — K219 Gastro-esophageal reflux disease without esophagitis: Secondary | ICD-10-CM | POA: Diagnosis not present

## 2023-09-18 DIAGNOSIS — I5030 Unspecified diastolic (congestive) heart failure: Secondary | ICD-10-CM | POA: Diagnosis not present

## 2023-09-19 DIAGNOSIS — K219 Gastro-esophageal reflux disease without esophagitis: Secondary | ICD-10-CM | POA: Diagnosis not present

## 2023-09-19 DIAGNOSIS — I5030 Unspecified diastolic (congestive) heart failure: Secondary | ICD-10-CM | POA: Diagnosis not present

## 2023-09-19 DIAGNOSIS — I110 Hypertensive heart disease with heart failure: Secondary | ICD-10-CM | POA: Diagnosis not present

## 2023-09-19 DIAGNOSIS — L03213 Periorbital cellulitis: Secondary | ICD-10-CM | POA: Diagnosis not present

## 2023-09-20 DIAGNOSIS — L03213 Periorbital cellulitis: Secondary | ICD-10-CM | POA: Diagnosis not present

## 2023-09-21 ENCOUNTER — Ambulatory Visit: Admitting: Cardiology

## 2023-09-21 DIAGNOSIS — L03213 Periorbital cellulitis: Secondary | ICD-10-CM | POA: Diagnosis not present

## 2023-09-22 DIAGNOSIS — L03213 Periorbital cellulitis: Secondary | ICD-10-CM | POA: Diagnosis not present

## 2023-09-23 DIAGNOSIS — L03213 Periorbital cellulitis: Secondary | ICD-10-CM | POA: Diagnosis not present

## 2023-09-24 DIAGNOSIS — B3731 Acute candidiasis of vulva and vagina: Secondary | ICD-10-CM

## 2023-09-24 DIAGNOSIS — L03213 Periorbital cellulitis: Secondary | ICD-10-CM | POA: Diagnosis not present

## 2023-09-24 HISTORY — DX: Acute candidiasis of vulva and vagina: B37.31

## 2023-09-29 ENCOUNTER — Other Ambulatory Visit: Payer: Self-pay | Admitting: Cardiology

## 2023-10-03 NOTE — Progress Notes (Signed)
 Cardiology Office Note:  .   Date:  10/05/2023  ID:  Lindsey Murphy, DOB 02-18-1956, MRN [id number] PCP: Magdaline Debby HERO, MD   HeartCare Providers Cardiologist:  Redell Leiter, MD Electrophysiologist:  Will [name], MD    History of Present Illness: Lindsey Murphy is a 68 y.o. female with a past medical history of diastolic heart failure, PVCs, ischemic cardiomyopathy, bradycardia with SSS s/p PPM, nonobstructive CAD, PVD, OSA on CPAP, hyperlipidemia.   07/28/2023 recent device check was normal 09/09/2020 echo EF 45 to 50%, grade 2 DD Coronary CTA at [facility] 07/30/2016 revealed a calcium  score of 11, mild nonobstructive CAD 06/25/2016 left heart cath at [facility], normal coronary arteries, felt to be vasospasm 01/18/2016 PPM implantation  She is a longstanding patient of Dr. Leandrew initially established with him in 2016.  In 2017 she underwent PPM implantation for symptomatic bradycardia.  In 2018 she had a left heart cath revealing normal coronary arteries, later that year she had a coronary CTA at [facility] revealing a calcium  score of 11.  Most recently evaluated by myself on 09/28/2022, stable from a cardiac perspective, busy caring for her mother, a few episodes of dizziness, but overall doing well.  Evaluated by Dr. Norton on 12/27/2022, she was stable, no changes made to her device, PVCs suppressed on mexiletine and she was advised she can follow-up with EP in 12 months.  She presents today for follow-up.  She has been doing well since she was last evaluated in our office from a cardiac perspective.  She did have a recent admission to [facility] for periorbital cellulitis, was in the hospital approximately a week and is still recovering from this.  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  She is also dealing with orthopedic issues, thinks that she will need to have her hip  replaced in the future however caring for her mother is her priority right now.  She has pedal edema however this is at baseline for her and she wears compression socks and feels it is well-controlled.  She is not bothered by chest pain, shortness of breath, dizziness, palpitations, orthopnea.   ROS: Review of Systems  Constitutional: Negative.   HENT: Negative.    Eyes: Negative.   Respiratory: Negative.    Cardiovascular:  Positive for leg swelling.  Gastrointestinal: Negative.   Genitourinary: Negative.   Musculoskeletal: Negative.   Skin: Negative.   Neurological:  Positive for dizziness (on occasion).  Endo/Heme/Allergies: Negative.   Psychiatric/Behavioral: Negative.       Studies Reviewed: [name]   EKG Interpretation Date/Time:  Wednesday October 05 2023 10:14:34 EDT Ventricular Rate:  59 PR Interval:  218 QRS Duration:  96 QT Interval:  408 QTC Calculation: 403 R Axis:   5  Text Interpretation: Sinus bradycardia with 1st degree A-V block with occasional Premature ventricular complexes Low voltage QRS Cannot rule out Anterior infarct , age undetermined When compared with ECG of 27-Dec-2022 08:57, Premature ventricular complexes are now Present QRS axis Shifted right Confirmed by Carlin Nest [id number]) on 10/05/2023 10:17:18 AM    Cardiac Studies & Procedures   ______________________________________________________________________________________________   STRESS TESTS  EXERCISE TOLERANCE TEST (ETT) 02/02/2017  Interpretation Summary  Blood pressure demonstrated a hypertensive response to exercise.  There was no ST segment deviation noted during stress.  Normal ETT No ischemia HTN response to exercise   ECHOCARDIOGRAM  ECHOCARDIOGRAM COMPLETE  09/09/2020  Narrative ECHOCARDIOGRAM REPORT    Patient Name:   Lindsey Murphy Date of Exam: 09/09/2020 Medical Rec #:  [id number]        Height:       64.0 in Accession #:    [id number]       Weight:       242.2 lb Date  of Birth:  [birth year]         BSA:          2.122 m Patient Age:    65 years         BP:           110/70 mmHg Patient Gender: F                HR:           69 bpm. Exam Location:  [facility]  Procedure: 2D Echo  Indications:    Congenital anomaly of coronary artery [Q24.5 (ICD-10-CM)]; Cardiac microvascular disease [I25.89 (ICD-10-CM)]; Sick sinus syndrome (HCC) [I49.5 (ICD-10-CM)]; Pacemaker [Z95.0 (ICD-10-CM)]; PVC's (premature ventricular contractions) [I49.3 (ICD-10-CM)]; Mixed hyperlipidemia [E78.2 (ICD-10-CM)]; Chronic combined systolic and diastolic congestive heart failure (HCC) [I50.42 (ICD-10-CM)]  History:        Patient has prior history of Echocardiogram examinations, most recent 12/29/2015.  Sonographer:    Lynwood Silvas Referring Phys: [id number] BRIAN J MUNLEY  IMPRESSIONS   1. Left ventricular ejection fraction, by estimation, is 45 to 50%. The left ventricle has mildly decreased function. The left ventricle has no regional wall motion abnormalities. The left ventricular internal cavity size was moderately dilated. Left ventricular diastolic parameters are consistent with Grade II diastolic dysfunction (pseudonormalization). The average left ventricular global longitudinal strain is -13.1 %. The global longitudinal strain is abnormal. 2. Right ventricular systolic function is normal. The right ventricular size is normal. There is normal pulmonary artery systolic pressure. 3. Moderate eccentric posterioly directed mitral valve regurgitation. No evidence of mitral stenosis. 4. The aortic valve is normal in structure. Aortic valve regurgitation is not visualized. No aortic stenosis is present. 5. The inferior vena cava is normal in size with greater than 50% respiratory variability, suggesting right atrial pressure of 3 mmHg.  FINDINGS Left Ventricle: Left ventricular ejection fraction, by estimation, is 45 to 50%. The left ventricle has mildly decreased function. The left  ventricle has no regional wall motion abnormalities. The average left ventricular global longitudinal strain is -13.1 %. The global longitudinal strain is abnormal. The left ventricular internal cavity size was moderately dilated. There is no left ventricular hypertrophy. Left ventricular diastolic parameters are consistent with Grade II diastolic dysfunction (pseudonormalization).  Right Ventricle: The right ventricular size is normal. No increase in right ventricular wall thickness. Right ventricular systolic function is normal. There is normal pulmonary artery systolic pressure. The tricuspid regurgitant velocity is 2.32 m/s, and with an assumed right atrial pressure of 3 mmHg, the estimated right ventricular systolic pressure is 24.5 mmHg.  Left Atrium: Left atrial size was normal in size.  Right Atrium: Right atrial size was normal in size.  Pericardium: There is no evidence of pericardial effusion.  Mitral Valve: The mitral valve is normal in structure. Moderate mitral valve regurgitation, with eccentric posteriorly directed jet. No evidence of mitral valve stenosis.  Tricuspid Valve: The tricuspid valve is normal in structure. Tricuspid valve regurgitation is trivial. No evidence of tricuspid stenosis.  Aortic Valve: The aortic valve is normal in structure. Aortic valve regurgitation is not visualized. Aortic regurgitation PHT measures 686 msec. No  aortic stenosis is present.  Pulmonic Valve: The pulmonic valve was normal in structure. Pulmonic valve regurgitation is trivial. No evidence of pulmonic stenosis.  Aorta: The aortic root is normal in size and structure.  Venous: The inferior vena cava is normal in size with greater than 50% respiratory variability, suggesting right atrial pressure of 3 mmHg.  IAS/Shunts: No atrial level shunt detected by color flow Doppler.  Additional Comments: A device lead is visualized.   LEFT VENTRICLE PLAX 2D LVIDd:         5.80 cm       Diastology LVIDs:         4.40 cm      LV e' medial:    6.20 cm/s LV PW:         1.00 cm      LV E/e' medial:  18.1 LV IVS:        1.00 cm      LV e' lateral:   6.74 cm/s LVOT diam:     2.00 cm      LV E/e' lateral: 16.6 LV SV:         68 LV SV Index:   32           2D Longitudinal Strain LVOT Area:     3.14 cm     2D Strain GLS Avg:     -13.1 %  LV Volumes (MOD) LV vol d, MOD A2C: 97.5 ml LV vol d, MOD A4C: 106.0 ml LV vol s, MOD A2C: 49.3 ml LV vol s, MOD A4C: 52.3 ml LV SV MOD A2C:     48.2 ml LV SV MOD A4C:     106.0 ml LV SV MOD BP:      51.9 ml  RIGHT VENTRICLE             IVC RV S prime:     12.40 cm/s  IVC diam: 1.40 cm TAPSE (M-mode): 3.1 cm  LEFT ATRIUM             Index       RIGHT ATRIUM           Index LA diam:        3.80 cm 1.79 cm/m  RA Area:     14.60 cm LA Vol (A2C):   61.8 ml 29.12 ml/m RA Volume:   38.00 ml  17.91 ml/m LA Vol (A4C):   55.9 ml 26.34 ml/m LA Biplane Vol: 61.7 ml 29.07 ml/m AORTIC VALVE LVOT Vmax:   87.20 cm/s LVOT Vmean:  57.300 cm/s LVOT VTI:    0.216 m AI PHT:      686 msec  AORTA Ao Root diam: 3.20 cm Ao Asc diam:  3.00 cm Ao Desc diam: 2.00 cm  MITRAL VALVE                 TRICUSPID VALVE MV Area (PHT): 3.26 cm      TR Peak grad:   21.5 mmHg MV Decel Time: 233 msec      TR Vmax:        232.00 cm/s MR Peak grad:    98.0 mmHg MR Mean grad:    64.0 mmHg   SHUNTS MR Vmax:         495.00 cm/s Systemic VTI:  0.22 m MR Vmean:        370.0 cm/s  Systemic Diam: 2.00 cm MR PISA:         1.01 cm MR PISA Eff  ROA: 8 mm MR PISA Radius:  0.40 cm MV E velocity: 112.00 cm/s MV A velocity: 100.00 cm/s MV E/A ratio:  1.12  Kardie Tobb DO Electronically signed by Dub Huntsman DO Signature Date/Time: 09/09/2020/1:00:12 PM    Final          ______________________________________________________________________________________________      Risk Assessment/Calculations:             Physical Exam:   VS:  BP 120/80   Pulse  (!) 59   Ht 5' 4 (1.626 m)   Wt 229 lb (103.9 kg)   SpO2 97%   BMI 39.31 kg/m    Wt Readings from Last 3 Encounters:  10/05/23 229 lb (103.9 kg)  12/27/22 228 lb (103.4 kg)  09/28/22 232 lb 3.2 oz (105.3 kg)    GEN: Well nourished, well developed in no acute distress NECK: No JVD; No carotid bruits CARDIAC: RRR, no murmurs, rubs, gallops RESPIRATORY:  Clear to auscultation without rales, wheezing or rhonchi  ABDOMEN: Soft, non-tender, non-distended EXTREMITIES: Trace edema compression socks present; No deformity   ASSESSMENT AND PLAN: .   Coronary artery disease-mild nonobstructive per coronary CTA in 2018. Stable with no anginal symptoms. No indication for ischemic evaluation.  Continue aspirin  81 mg daily, continue Lipitor 10 mg daily, continue Norvasc  2.5 mg daily, continue metoprolol  50 mg twice daily, continue nitroglycerin  as needed.  Ischemic cardiomyopathy/HFmrEF-echo in 2022 EF 45 to 50%, grade 2 DD.  NYHA class I, euvolemic.  Continue metoprolol  50 mg twice daily.  Continue Lasix 20 mg twice daily.  Consider SGLT2 at next appt.   Sick sinus syndrome/presence of PPM-most recent device interrogation showed normal functioning PPM.  Will see EP ~ October.   PVCs -noted on device interrogation as high as 8%, quiescent for her, was started on mexiletine and she has not noticed further palpitations. Magnesium  2.0 ~ 1 week ago, LFTs ok as well at that time.   Hyperlipidemia-most recent LDL was well-controlled at 68, this is being followed by her PCP, continue Lipitor 10 mg daily.  OSA on CPAP - compliant.         Dispo: Follow-up in 1 year.   Signed, Delon JAYSON Hoover, NP

## 2023-10-05 ENCOUNTER — Ambulatory Visit: Attending: Cardiology | Admitting: Cardiology

## 2023-10-05 ENCOUNTER — Encounter: Payer: Self-pay | Admitting: Cardiology

## 2023-10-05 VITALS — BP 120/80 | HR 59 | Ht 64.0 in | Wt 229.0 lb

## 2023-10-05 DIAGNOSIS — I5022 Chronic systolic (congestive) heart failure: Secondary | ICD-10-CM

## 2023-10-05 DIAGNOSIS — I5020 Unspecified systolic (congestive) heart failure: Secondary | ICD-10-CM

## 2023-10-05 DIAGNOSIS — I25118 Atherosclerotic heart disease of native coronary artery with other forms of angina pectoris: Secondary | ICD-10-CM

## 2023-10-05 DIAGNOSIS — I493 Ventricular premature depolarization: Secondary | ICD-10-CM

## 2023-10-05 DIAGNOSIS — Z950 Presence of cardiac pacemaker: Secondary | ICD-10-CM

## 2023-10-05 DIAGNOSIS — Z79899 Other long term (current) drug therapy: Secondary | ICD-10-CM

## 2023-10-05 DIAGNOSIS — I495 Sick sinus syndrome: Secondary | ICD-10-CM | POA: Diagnosis not present

## 2023-10-05 NOTE — Patient Instructions (Signed)

## 2023-10-14 DIAGNOSIS — L400 Psoriasis vulgaris: Secondary | ICD-10-CM | POA: Diagnosis not present

## 2023-10-14 DIAGNOSIS — L299 Pruritus, unspecified: Secondary | ICD-10-CM | POA: Diagnosis not present

## 2023-10-25 ENCOUNTER — Ambulatory Visit: Payer: Medicare Other

## 2023-10-25 DIAGNOSIS — I495 Sick sinus syndrome: Secondary | ICD-10-CM | POA: Diagnosis not present

## 2023-10-26 ENCOUNTER — Ambulatory Visit: Payer: Self-pay | Admitting: Cardiology

## 2023-10-26 LAB — CUP PACEART REMOTE DEVICE CHECK
Battery Voltage: 40
Date Time Interrogation Session: 20250812102811
Implantable Lead Connection Status: 753985
Implantable Lead Connection Status: 753985
Implantable Lead Implant Date: 20171205
Implantable Lead Implant Date: 20171205
Implantable Lead Location: 753859
Implantable Lead Location: 753860
Implantable Lead Model: 377
Implantable Lead Model: 377
Implantable Lead Serial Number: 49684101
Implantable Lead Serial Number: 49719338
Implantable Pulse Generator Implant Date: 20171205
Pulse Gen Model: 407145
Pulse Gen Serial Number: 68906976

## 2023-12-02 DIAGNOSIS — G4733 Obstructive sleep apnea (adult) (pediatric): Secondary | ICD-10-CM | POA: Diagnosis not present

## 2023-12-02 DIAGNOSIS — R0981 Nasal congestion: Secondary | ICD-10-CM | POA: Insufficient documentation

## 2023-12-02 DIAGNOSIS — J342 Deviated nasal septum: Secondary | ICD-10-CM | POA: Diagnosis not present

## 2023-12-02 DIAGNOSIS — J343 Hypertrophy of nasal turbinates: Secondary | ICD-10-CM | POA: Diagnosis not present

## 2023-12-02 DIAGNOSIS — J3489 Other specified disorders of nose and nasal sinuses: Secondary | ICD-10-CM | POA: Diagnosis not present

## 2023-12-03 DIAGNOSIS — Z880 Allergy status to penicillin: Secondary | ICD-10-CM | POA: Diagnosis not present

## 2023-12-03 DIAGNOSIS — Z79899 Other long term (current) drug therapy: Secondary | ICD-10-CM | POA: Diagnosis not present

## 2023-12-03 DIAGNOSIS — R040 Epistaxis: Secondary | ICD-10-CM | POA: Diagnosis not present

## 2023-12-03 DIAGNOSIS — Z7982 Long term (current) use of aspirin: Secondary | ICD-10-CM | POA: Diagnosis not present

## 2023-12-07 DIAGNOSIS — J3489 Other specified disorders of nose and nasal sinuses: Secondary | ICD-10-CM | POA: Diagnosis not present

## 2023-12-07 DIAGNOSIS — R040 Epistaxis: Secondary | ICD-10-CM | POA: Diagnosis not present

## 2023-12-07 DIAGNOSIS — D696 Thrombocytopenia, unspecified: Secondary | ICD-10-CM | POA: Diagnosis not present

## 2023-12-07 DIAGNOSIS — J342 Deviated nasal septum: Secondary | ICD-10-CM | POA: Diagnosis not present

## 2023-12-08 NOTE — Progress Notes (Signed)
 Remote PPM Transmission

## 2023-12-21 DIAGNOSIS — R040 Epistaxis: Secondary | ICD-10-CM | POA: Diagnosis not present

## 2023-12-21 DIAGNOSIS — J342 Deviated nasal septum: Secondary | ICD-10-CM | POA: Diagnosis not present

## 2024-01-03 DIAGNOSIS — M150 Primary generalized (osteo)arthritis: Secondary | ICD-10-CM | POA: Insufficient documentation

## 2024-01-04 NOTE — Progress Notes (Unsigned)
 Cardiology Office Note:    Date:  01/05/2024   ID:  Comer Mayer, DOB October 19, 1955, MRN [id number]  PCP:  Magdaline Debby HERO, MD Cardiologist:  Redell Leiter, MD    Referring MD: Magdaline Debby HERO, MD    ASSESSMENT:    1. Hypertensive heart disease with chronic combined systolic and diastolic congestive heart failure (HCC)   2. Sick sinus syndrome (HCC)   3. Pacemaker   4. PVC's (premature ventricular contractions)   5. High risk medication use   6. Coronary artery disease of native artery of native heart with stable angina pectoris   7. Mixed hyperlipidemia    PLAN:    In order of problems listed above:  Appears decompensated however I wonder how much of this is related to her calcium  channel blocker Transition to torsemide twice a day 1 week check labs including BMP proBNP Discontinue her calcium  channel blocker Recheck echocardiogram EF previously mildly reduced if worsen we need to engage guideline to treatment including Entresto MRA and SGLT2 inhibitor Stable pacemaker although the rate ports that she is ventricularly paced the complexes narrow and there is mild fusion Stable PVCs Stable CAD Continue statin will check a lipid profile with her labs 1 week   Next appointment: See her in 2 months   Medication Adjustments/Labs and Tests Ordered: Current medicines are reviewed at length with the patient today.  Concerns regarding medicines are outlined above.  Orders Placed This Encounter  Procedures   EKG 12-Lead   No orders of the defined types were placed in this encounter.    History of Present Illness:    Simon Aaberg is a 68 y.o. 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  Recently 12/07/2023 CBC with a hemoglobin of 12.4 platelets 120,000 decreased CMP creatinine 0.8 GFR 81 cc/min sodium 136 potassium 4.5  Last device check in this showed her to have a dual-chamber paced rhythm with stable parameters.  Thank you very much  Compliance with diet, lifestyle and medications yes:   She has had multiple medical illnesses hospitalizations epistasis eye infection antibiotics Although her weight is only up about 13 pounds she has developed very lower extremity edema mostly distally.  She also is a little bit more short of breath but not dramatically she takes a calcium  channel blocker.  When she takes furosemide she does not get a marked effect Not having chest pain palpitation or syncope Past Medical History:  Diagnosis Date   Acute cystitis 06/25/2016   Arthritis    Asthma with hay fever, mild intermittent, uncomplicated 05/31/2017   Asymmetrical hearing loss 06/17/2021   Bell's palsy 08/07/2019   Bile reflux esophagitis 04/29/2015   BMI 40.0-44.9, adult (HCC) 08/23/2016   Cellulitis of lower limb 05/24/2017   Chest pain 06/25/2016   Chronic diastolic congestive heart failure (HCC) 07/06/2016   Class 2 severe obesity with serious comorbidity and body mass index (BMI) of 39.0 to 39.9 in adult 03/11/2021   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 Overview:  LCFx, benign variant Formatting of this note might be different from the original. LCFx, benign variant  Coronary artery disease of native artery of native heart with stable angina pectoris 04/29/2015   Deviated septum 03/20/2021   Drug therapy 04/27/2015   Epistaxis, recurrent 03/20/2021   ETD (Eustachian tube dysfunction), bilateral 03/20/2021   Frequent PVCs 10/05/2015   Overview:  With normal LV EF% on a beta blocker  Formatting of  this note might be different from the original. With normal LV EF% on a beta blocker   GERD (gastroesophageal reflux disease)    Hay fever 11/24/2015   Heart murmur    High risk medication use 04/27/2017   Flecanide   History of hip replacement 12/17/2014   History of repair of hip joint 12/17/2014   Hx of Bell's palsy    Hyperlipidemia 04/27/2015   Hypertension    Hypertensive heart disease with chronic systolic congestive heart failure (HCC) 07/22/2016   Hypertrophy of both inferior nasal turbinates 12/02/2023   IBS (irritable bowel syndrome) 04/29/2015   Ischemic cardiomyopathy 09/15/2016   Cath with nonSTEMI 06/25/2016 Conclusions:  Normal coronary arteries. Anomalous dominant circ from R sinus Mild inferior segmental LV systolic dysfunction  CTA 07/21/16 ejection fraction 46%  Formatting of this note might be different from the original. Overview:  Cath with nonSTEMI 06/25/2016 Conclusions:  Normal coronary arteries. Anomalous dominant circ from R sinus Mild inferior segmental LV sys   LFT elevation 02/28/2017   Long term current use of clopidogrel  03/11/2021   LVH (left ventricular hypertrophy) 04/29/2015   Migraine 04/29/2015   Mild CAD 11/21/2014   Myocardial infarction (HCC)    12/2012 - vasospasms    Nasal congestion 12/02/2023   NSTEMI (non-ST elevated myocardial infarction) (HCC) 06/25/2016   Old MI (myocardial infarction) 04/27/2015   NSTEMI 2014 and recent NSTEMI 06/25/16      Formatting of this note might be different from the original. Overview:  NSTEMI 2014 and recent NSTEMI 06/25/16   OSA on CPAP 04/29/2015   Osteoarthritis 04/29/2015   Pacemaker 02/18/2016   Dual chamber BIOTRONIC [place]        Pacemaker lead failure 02/23/2016   Pacemaker reprogramming/check 02/17/2016   Pain in left knee 05/04/2017   Pain in right foot 05/04/2017   Peripheral vascular disease    Pink eye disease of left eye    diagnosed 12/06/2014    Pneumonia    hx of pneumonia     Preoperative cardiovascular examination 11/21/2014   Primary osteoarthritis involving multiple joints 01/03/2024   Psoriasis 05/18/2015   Psoriatic arthritis (HCC) 04/29/2015   S/P left THA, AA 12/17/2014   Sciatica, right side 04/29/2015   Seasonal allergies 08/07/2019   Severe sinus bradycardia 09/15/2016   Sick sinus syndrome (HCC) 02/18/2016   Sinus bradycardia 01/01/2016   Sleep apnea    cpap- does not know settings   Thrombophlebitis of superficial veins of left lower extremity 02/26/2019   Thyroid nodule 03/16/2017   TSH elevation 09/08/2018   Vaginal yeast infection 09/24/2023   Varicose veins of bilateral lower extremities with pain 03/20/2019   Venous insufficiency 04/29/2015   Vitamin D deficiency 04/29/2015    Current Medications: Current Meds  Medication Sig   Albuterol Sulfate (PROAIR RESPICLICK) 108 (90 Base) MCG/ACT AEPB Inhale 2 puffs into the lungs every 4 (four) hours as needed (Wheezing).   amLODipine  (NORVASC ) 2.5 MG tablet Take 1 tablet (2.5 mg total) by mouth daily.   aspirin  EC 81 MG tablet Take 1 tablet (81 mg total) by mouth daily. Swallow whole.   atorvastatin  (LIPITOR) 10 MG tablet Take  1 tablet (10 mg total) by mouth daily.   Azelastine  HCl 0.15 % SOLN Place 1 spray into the nose 2 (two) times daily.    calcium -vitamin D (OSCAL WITH D) 500-200 MG-UNIT TABS tablet Take 1 tablet by mouth 2 (two) times daily.   Cholecalciferol (VITAMIN D PO) Take 5,000 Units by mouth daily.    famotidine (PEPCID) 20 MG tablet Take 20 mg by mouth daily as needed for heartburn or indigestion.   furosemide (LASIX) 20 MG tablet Take 20 mg by mouth 2 (two) times daily.    glucosamine-chondroitin 500-400 MG tablet Take 1 tablet by mouth 2 (two) times daily.    guaiFENesin  (MUCINEX ) 600 MG 12 hr tablet Take 600 mg by mouth at bedtime.    halobetasol  (ULTRAVATE ) 0.05 % ointment Apply 1 application topically 2 (two) times daily as needed (Psoriasis . rash).   loratadine  (CLARITIN )  10 MG tablet Take 10 mg by mouth daily.   metoprolol  succinate (TOPROL -XL) 50 MG 24 hr tablet Take 1 tablet (50 mg total) by mouth 2 (two) times daily. Take with or immediately following a meal.   mexiletine (MEXITIL) 200 MG capsule TAKE 1 CAPSULE TWICE DAILY   montelukast  (SINGULAIR ) 10 MG tablet Take 10 mg by mouth at bedtime.   Multiple Vitamin (MULTIVITAMIN WITH MINERALS) TABS tablet Take 1 tablet by mouth daily.   nitroGLYCERIN  (NITROSTAT ) 0.4 MG SL tablet Place 1 tablet (0.4 mg total) under the tongue every 5 (five) minutes as needed for chest pain.   potassium chloride  (KLOR-CON ) 10 MEQ tablet Take 10 mEq by mouth daily.   vitamin C (ASCORBIC ACID) 500 MG tablet Take 500 mg by mouth daily.      EKGs/Labs/Other Studies Reviewed:    The following studies were reviewed today:  EKG Interpretation Date/Time:  Thursday January 05 2024 11:05:08 EDT Ventricular Rate:  68 PR Interval:  230 QRS Duration:  92 QT Interval:  394 QTC Calculation: 418 R Axis:   1  Text Interpretation: Atrial-paced rhythm with prolonged AV conduction Ventricular complex is fused When compared with ECG of 05-Oct-2023 10:14, Electronic atrial pacemaker has replaced Sinus rhythm Confirmed by Monetta Rogue ([id number]) on 01/05/2024 11:08:44 AM      Recent Lipid Panel    Component Value Date/Time   CHOL 136 03/24/2021 1132   TRIG 137 03/24/2021 1132   HDL 44 03/24/2021 1132   CHOLHDL 3.1 03/24/2021 1132   LDLCALC 68 03/24/2021 1132    Physical Exam:    VS:  BP 120/80   Pulse 68   Ht 5' 4 (1.626 m)   Wt 235 lb 6.4 oz (106.8 kg)   SpO2 99%   BMI 40.41 kg/m     Wt Readings from Last 3 Encounters:  01/05/24 235 lb 6.4 oz (106.8 kg)  10/05/23 229 lb (103.9 kg)  12/27/22 228 lb (103.4 kg)     GEN:  Well nourished, well developed in no acute distress HEENT: Normal NECK: No JVD; No carotid bruits LYMPHATICS: No lymphadenopathy CARDIAC: RRR, no murmurs, rubs, gallops RESPIRATORY:  Clear to  auscultation without rales, wheezing or rhonchi  ABDOMEN: Soft, non-tender, non-distended MUSCULOSKELETAL: She has marked bilateral lower extremity edema; No deformity  SKIN: Warm and dry NEUROLOGIC:  Alert and oriented x 3 PSYCHIATRIC:  Normal affect    Signed, Rogue Monetta, MD  01/05/2024 11:27 AM    [facility]

## 2024-01-05 ENCOUNTER — Encounter: Payer: Self-pay | Admitting: Cardiology

## 2024-01-05 ENCOUNTER — Ambulatory Visit: Attending: Cardiology | Admitting: Cardiology

## 2024-01-05 ENCOUNTER — Ambulatory Visit: Admitting: Cardiology

## 2024-01-05 VITALS — BP 120/80 | HR 68 | Ht 64.0 in | Wt 235.4 lb

## 2024-01-05 DIAGNOSIS — Z950 Presence of cardiac pacemaker: Secondary | ICD-10-CM | POA: Diagnosis not present

## 2024-01-05 DIAGNOSIS — I5042 Chronic combined systolic (congestive) and diastolic (congestive) heart failure: Secondary | ICD-10-CM

## 2024-01-05 DIAGNOSIS — Z79899 Other long term (current) drug therapy: Secondary | ICD-10-CM

## 2024-01-05 DIAGNOSIS — I493 Ventricular premature depolarization: Secondary | ICD-10-CM

## 2024-01-05 DIAGNOSIS — I495 Sick sinus syndrome: Secondary | ICD-10-CM

## 2024-01-05 DIAGNOSIS — I110 Hypertensive heart disease with heart failure: Secondary | ICD-10-CM

## 2024-01-05 DIAGNOSIS — E782 Mixed hyperlipidemia: Secondary | ICD-10-CM

## 2024-01-05 DIAGNOSIS — I25118 Atherosclerotic heart disease of native coronary artery with other forms of angina pectoris: Secondary | ICD-10-CM

## 2024-01-05 MED ORDER — TORSEMIDE 20 MG PO TABS: 20.0000 mg | ORAL_TABLET | Freq: Two times a day (BID) | ORAL | 0 refills | Status: DC

## 2024-01-05 MED ORDER — TORSEMIDE 20 MG PO TABS: 20.0000 mg | ORAL_TABLET | Freq: Every day | ORAL | 3 refills | Status: DC

## 2024-01-05 NOTE — Addendum Note (Signed)
 Addended by: ONEITA BERLINER on: 01/05/2024 11:51 AM   Modules accepted: Orders

## 2024-01-05 NOTE — Patient Instructions (Signed)
 Medication Instructions:  Your physician has recommended you make the following change in your medication:   Stop Furosemide  Stop Amlodipine   Start Torsemide 20 mg 2 times daily  *If you need a refill on your cardiac medications before your next appointment, please call your pharmacy*   Lab Work: Your physician recommends that you return for lab work in: 1 week BMP, ProBNP and fasting lipids. You need to have labs done when you are fasting.  You can come Monday through Friday 8:30 am to 12:00 pm and 1:15 to 4:30. You do not need to make an appointment as the order has already been placed.   If you have labs (blood work) drawn today and your tests are completely normal, you will receive your results only by: MyChart Message (if you have MyChart) OR A paper copy in the mail If you have any lab test that is abnormal or we need to change your treatment, we will call you to review the results.  Testing/Procedures: Your physician has requested that you have an echocardiogram. Echocardiography is a painless test that uses sound waves to create images of your heart. It provides your doctor with information about the size and shape of your heart and how well your heart's chambers and valves are working. This procedure takes approximately one hour. There are no restrictions for this procedure. Please do NOT wear cologne, perfume, aftershave, or lotions (deodorant is allowed). Please arrive 15 minutes prior to your appointment time.  Please note: We ask at that you not bring children with you during ultrasound (echo/ vascular) testing. Due to room size and safety concerns, children are not allowed in the ultrasound rooms during exams. Our front office staff cannot provide observation of children in our lobby area while testing is being conducted. An adult accompanying a patient to their appointment will only be allowed in the ultrasound room at the discretion of the ultrasound technician under  special circumstances. We apologize for any inconvenience.  Follow-Up: At [facility], you and your health needs are our priority.  As part of our continuing mission to provide you with exceptional heart care, we have created designated Provider Care Teams.  These Care Teams include your primary Cardiologist (physician) and Advanced Practice Providers (APPs -  Physician Assistants and Nurse Practitioners) who all work together to provide you with the care you need, when you need it.  We recommend signing up for the patient portal called MyChart.  Sign up information is provided on this After Visit Summary.  MyChart is used to connect with patients for Virtual Visits (Telemedicine).  Patients are able to view lab/test results, encounter notes, upcoming appointments, etc.  Non-urgent messages can be sent to your provider as well.   To learn more about what you can do with MyChart, go to [email or web address].    Your next appointment:   2 month(s)  The format for your next appointment:   In Person  Provider:   Redell Leiter, MD   Other Instructions Echocardiogram An echocardiogram is a test that uses sound waves (ultrasound) to produce images of the heart. Images from an echocardiogram can provide important information about: Heart size and shape. The size and thickness and movement of your heart's walls. Heart muscle function and strength. Heart valve function or if you have stenosis. Stenosis is when the heart valves are too narrow. If blood is flowing backward through the heart valves (regurgitation). A tumor or infectious growth around the heart valves. Areas of heart muscle  that are not working well because of poor blood flow or injury from a heart attack. Aneurysm detection. An aneurysm is a weak or damaged part of an artery wall. The wall bulges out from the normal force of blood pumping through the body. Tell a health care provider about: Any allergies you have. All  medicines you are taking, including vitamins, herbs, eye drops, creams, and over-the-counter medicines. Any blood disorders you have. Any surgeries you have had. Any medical conditions you have. Whether you are pregnant or may be pregnant. What are the risks? Generally, this is a safe test. However, problems may occur, including an allergic reaction to dye (contrast) that may be used during the test. What happens before the test? No specific preparation is needed. You may eat and drink normally. What happens during the test? You will take off your clothes from the waist up and put on a hospital gown. Electrodes or electrocardiogram (ECG)patches may be placed on your chest. The electrodes or patches are then connected to a device that monitors your heart rate and rhythm. You will lie down on a table for an ultrasound exam. A gel will be applied to your chest to help sound waves pass through your skin. A handheld device, called a transducer, will be pressed against your chest and moved over your heart. The transducer produces sound waves that travel to your heart and bounce back (or echo back) to the transducer. These sound waves will be captured in real-time and changed into images of your heart that can be viewed on a video monitor. The images will be recorded on a computer and reviewed by your health care provider. You may be asked to change positions or hold your breath for a short time. This makes it easier to get different views or better views of your heart. In some cases, you may receive contrast through an IV in one of your veins. This can improve the quality of the pictures from your heart. The procedure may vary among health care providers and hospitals.   What can I expect after the test? You may return to your normal, everyday life, including diet, activities, and medicines, unless your health care provider tells you not to do that. Follow these instructions at home: It is up to you  to get the results of your test. Ask your health care provider, or the department that is doing the test, when your results will be ready. Keep all follow-up visits. This is important. Summary An echocardiogram is a test that uses sound waves (ultrasound) to produce images of the heart. Images from an echocardiogram can provide important information about the size and shape of your heart, heart muscle function, heart valve function, and other possible heart problems. You do not need to do anything to prepare before this test. You may eat and drink normally. After the echocardiogram is completed, you may return to your normal, everyday life, unless your health care provider tells you not to do that. This information is not intended to replace advice given to you by your health care provider. Make sure you discuss any questions you have with your health care provider. Document Revised: 10/23/2019 Document Reviewed: 10/23/2019 Elsevier Patient Education  2021 Elsevier Inc.   Important Information About Sugar

## 2024-01-13 ENCOUNTER — Encounter: Payer: Self-pay | Admitting: Cardiology

## 2024-01-14 DIAGNOSIS — E8801 Alpha-1-antitrypsin deficiency: Secondary | ICD-10-CM | POA: Insufficient documentation

## 2024-01-18 ENCOUNTER — Ambulatory Visit: Payer: Self-pay | Admitting: Cardiology

## 2024-01-18 DIAGNOSIS — Z79899 Other long term (current) drug therapy: Secondary | ICD-10-CM | POA: Diagnosis not present

## 2024-01-18 DIAGNOSIS — R7989 Other specified abnormal findings of blood chemistry: Secondary | ICD-10-CM | POA: Diagnosis not present

## 2024-01-18 LAB — LIPID PANEL
Chol/HDL Ratio: 2.4 ratio (ref 0.0–4.4)
Cholesterol, Total: 126 mg/dL (ref 100–199)
HDL: 52 mg/dL (ref >39)
LDL Chol Calc (NIH): 58 mg/dL (ref 0–99)
Triglycerides: 81 mg/dL (ref 0–149)
VLDL Cholesterol Cal: 16 mg/dL (ref 5–40)

## 2024-01-18 LAB — BASIC METABOLIC PANEL WITH GFR
BUN/Creatinine Ratio: 25 (ref 12–28)
BUN: 20 mg/dL (ref 8–27)
CO2: 25 mmol/L (ref 20–29)
Calcium: 8.8 mg/dL (ref 8.7–10.3)
Chloride: 103 mmol/L (ref 96–106)
Creatinine, Ser: 0.8 mg/dL (ref 0.57–1.00)
Glucose: 97 mg/dL (ref 70–99)
Potassium: 4.4 mmol/L (ref 3.5–5.2)
Sodium: 142 mmol/L (ref 134–144)
eGFR: 81 mL/min/1.73 (ref >59)

## 2024-01-18 LAB — PRO B NATRIURETIC PEPTIDE: NT-Pro BNP: 213 pg/mL (ref 0–301)

## 2024-01-19 DIAGNOSIS — K7469 Other cirrhosis of liver: Secondary | ICD-10-CM | POA: Insufficient documentation

## 2024-01-24 ENCOUNTER — Ambulatory Visit: Payer: Medicare Other

## 2024-01-24 DIAGNOSIS — I495 Sick sinus syndrome: Secondary | ICD-10-CM | POA: Diagnosis not present

## 2024-01-25 LAB — CUP PACEART REMOTE DEVICE CHECK
Battery Voltage: 40
Date Time Interrogation Session: 20251111084919
Implantable Lead Connection Status: 753985
Implantable Lead Connection Status: 753985
Implantable Lead Implant Date: 20171205
Implantable Lead Implant Date: 20171205
Implantable Lead Location: 753859
Implantable Lead Location: 753860
Implantable Lead Model: 377
Implantable Lead Model: 377
Implantable Lead Serial Number: 49684101
Implantable Lead Serial Number: 49719338
Implantable Pulse Generator Implant Date: 20171205
Pulse Gen Model: 407145
Pulse Gen Serial Number: 68906976

## 2024-01-27 ENCOUNTER — Ambulatory Visit: Attending: Cardiology

## 2024-01-27 DIAGNOSIS — I5042 Chronic combined systolic (congestive) and diastolic (congestive) heart failure: Secondary | ICD-10-CM | POA: Diagnosis not present

## 2024-01-27 DIAGNOSIS — E782 Mixed hyperlipidemia: Secondary | ICD-10-CM

## 2024-01-27 DIAGNOSIS — I110 Hypertensive heart disease with heart failure: Secondary | ICD-10-CM | POA: Diagnosis not present

## 2024-01-27 DIAGNOSIS — I25118 Atherosclerotic heart disease of native coronary artery with other forms of angina pectoris: Secondary | ICD-10-CM

## 2024-01-27 LAB — ECHOCARDIOGRAM COMPLETE
Area-P 1/2: 3.17 cm2
MV M vel: 4.86 m/s
MV Peak grad: 94.5 mmHg
P 1/2 time: 401 ms
Radius: 0.4 cm
S' Lateral: 3.9 cm

## 2024-01-27 NOTE — Progress Notes (Signed)
 Left message for the patient to call back.

## 2024-01-27 NOTE — Progress Notes (Signed)
 Remote PPM Transmission

## 2024-01-29 ENCOUNTER — Encounter: Payer: Self-pay | Admitting: Cardiology

## 2024-01-30 NOTE — Telephone Encounter (Signed)
 Pt requesting a c/b to discuss results.

## 2024-01-31 ENCOUNTER — Ambulatory Visit: Payer: Self-pay | Admitting: Cardiology

## 2024-02-01 ENCOUNTER — Telehealth: Payer: Self-pay | Admitting: Cardiology

## 2024-02-01 NOTE — Telephone Encounter (Signed)
 [employer] [facility] care/ Pharmacist called in and just wanted to make Gen [facility] aware of her Lab results for her her liver.  They are elevated and have concerns regarding the  atorvastatin  (LIPITOR) 10 MG tablet [[id number]] .  They were not sure if [facility] was aware of her blood work results    Best number [phone] Asked for Lindsey Murphy

## 2024-02-02 ENCOUNTER — Telehealth: Payer: Self-pay | Admitting: Cardiology

## 2024-02-02 NOTE — Telephone Encounter (Signed)
 Patient states that the patient centered care team with her insurance company wants her to talk to [facility] about coming off the atorvastatin  due to labs she had done showing issues with her liver enzymes.Her latest CMP is in Epic. Please advise.

## 2024-02-02 NOTE — Telephone Encounter (Signed)
  Pt c/o medication issue:  1. Name of Medication: atorvastatin  (LIPITOR) 10 MG tablet   2. How are you currently taking this medication (dosage and times per day)? As directed  3. Are you having a reaction (difficulty breathing--STAT)? no  4. What is your medication issue?  Patient states that the patient centered care team with her insurance company wants her to talk to [facility] about coming off the atorvastatin  due to labs she had done showing issues with her liver

## 2024-02-07 DIAGNOSIS — E8801 Alpha-1-antitrypsin deficiency: Secondary | ICD-10-CM | POA: Diagnosis not present

## 2024-02-07 NOTE — Telephone Encounter (Signed)
 Spoke to Dr. Monetta regarding her insurance wanting her to speak to him about coming off of her Atorvastatin  due to potential liver damage. Dr. Monetta recommended that the patient follow up with her PCP regarding stopping her Atorvastatin . This message was relayed to the patient and she verbalized understanding and had no further questions at this time.

## 2024-02-08 DIAGNOSIS — H04553 Acquired stenosis of bilateral nasolacrimal duct: Secondary | ICD-10-CM | POA: Diagnosis not present

## 2024-02-08 DIAGNOSIS — J342 Deviated nasal septum: Secondary | ICD-10-CM | POA: Diagnosis not present

## 2024-02-08 DIAGNOSIS — J3489 Other specified disorders of nose and nasal sinuses: Secondary | ICD-10-CM | POA: Diagnosis not present

## 2024-02-08 DIAGNOSIS — E8801 Alpha-1-antitrypsin deficiency: Secondary | ICD-10-CM | POA: Diagnosis not present

## 2024-02-16 ENCOUNTER — Encounter: Payer: Self-pay | Admitting: Cardiology

## 2024-02-20 ENCOUNTER — Other Ambulatory Visit: Payer: Self-pay

## 2024-02-20 MED ORDER — TORSEMIDE 40 MG PO TABS: 40.0000 mg | ORAL_TABLET | Freq: Every day | ORAL | 3 refills | Status: AC

## 2024-03-05 ENCOUNTER — Other Ambulatory Visit: Payer: Self-pay

## 2024-03-05 NOTE — Progress Notes (Unsigned)
 " Cardiology Office Note:    Date:  03/06/2024   ID:  Lindsey Murphy, DOB 30-Jan-1956, MRN [id number]  PCP:  Magdaline Debby HERO, MD  Cardiologist:  Redell Leiter, MD    Referring MD: Magdaline Debby HERO, MD    ASSESSMENT:    1. Hypertensive heart disease with chronic combined systolic and diastolic congestive heart failure (HCC)   2. Moderate mitral regurgitation   3. Sick sinus syndrome (HCC)   4. Pacemaker   5. PVC (premature ventricular contraction)   6. High risk medication use   7. Coronary artery disease of native artery of native heart with stable angina pectoris   8. Mixed hyperlipidemia    PLAN:    In order of problems listed above:  Stable heart failure is improved compensated EF remains normal continue her higher dose diuretic along with potassium repletion Stable mitral regurgitation not severe does not require valve intervention Stable sick sinus syndrome pacemaker and antiarrhythmic drug treatment continue mexiletine follow in device clinic Stable CAD on medical therapy including aspirin  and high intensity statin Being evaluated for liver disease [facility]   Next appointment: 6 months   Medication Adjustments/Labs and Tests Ordered: Current medicines are reviewed at length with the patient today.  Concerns regarding medicines are outlined above.  No orders of the defined types were placed in this encounter.  No orders of the defined types were placed in this encounter.    History of Present Illness:    Lindsey Murphy is a 67 y.o. 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  When seen in the office she was in decompensated heart failure with edema her diuretics were  intensified proBNP level was not elevated from baseline 213 and her echocardiogram performed after the visit showed a normal ejection fraction GLS and unfortunately diastolic function was defined as indeterminate.  She had moderate mitral regurgitation and mild aortic regurgitation.  Left atrial pressure was normal.  Compliance with diet, lifestyle and medications: Yes  Her cardiology perspective doing well no longer short of breath after increasing her diuretic. She has psoriasis she takes a biologic agent had an abnormal liver function test and tells me that she may have hepatic cirrhosis?  Fatty liver and is evaluated FibroScan [facility] and it sounds like she also had an ultrasound for portal hypertension.  She is awaiting results. 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 Overview:  LCFx, benign variant Formatting of this note might be different from the original. LCFx, benign variant   Coronary artery disease of native artery of native heart  with stable angina pectoris 04/29/2015   Deviated septum 03/20/2021   Drug therapy 04/27/2015   Epistaxis, recurrent 03/20/2021   ETD (Eustachian tube dysfunction), bilateral 03/20/2021   Frequent PVCs 10/05/2015   Overview:  With normal LV EF% on a beta blocker   Formatting of this note might be different from the original. With normal LV EF% on a beta blocker   GERD (gastroesophageal reflux disease)    Hay fever 11/24/2015   Heart murmur    High risk medication use 04/27/2017   Flecanide   History of hip replacement 12/17/2014   History of repair of hip joint 12/17/2014   Hx of Bell's palsy    Hyperlipidemia 04/27/2015   Hypertension    Hypertensive heart disease with chronic systolic congestive heart failure (HCC) 07/22/2016   Hypertrophy of both inferior nasal turbinates 12/02/2023   IBS (irritable bowel syndrome) 04/29/2015   Ischemic cardiomyopathy 09/15/2016   Cath with nonSTEMI 06/25/2016 Conclusions:  Normal coronary arteries. Anomalous dominant circ from R sinus Mild inferior segmental LV systolic dysfunction  CTA 07/21/16 ejection fraction 46%  Formatting of this note might be different from the original. Overview:  Cath with nonSTEMI 06/25/2016 Conclusions:  Normal coronary arteries. Anomalous dominant circ from R sinus Mild inferior segmental LV sys   LFT elevation 02/28/2017   Long term current use of clopidogrel  03/11/2021   LVH (left ventricular hypertrophy) 04/29/2015   Migraine 04/29/2015   Mild CAD 11/21/2014   Myocardial infarction (HCC)    12/2012 - vasospasms    Nasal congestion 12/02/2023   NSTEMI (non-ST elevated myocardial infarction) (HCC) 06/25/2016   Old MI (myocardial infarction) 04/27/2015   NSTEMI 2014 and recent NSTEMI 06/25/16      Formatting of this note might be different from the original. Overview:  NSTEMI 2014 and recent NSTEMI 06/25/16   OSA on CPAP 04/29/2015   Osteoarthritis 04/29/2015   Pacemaker 02/18/2016   Dual chamber BIOTRONIC [place]        Pacemaker lead failure 02/23/2016   Pacemaker reprogramming/check 02/17/2016   Pain in left knee 05/04/2017   Pain in right foot 05/04/2017   Peripheral vascular disease    Pink eye disease of left eye    diagnosed 12/06/2014    Pneumonia    hx of pneumonia     Preoperative cardiovascular examination 11/21/2014   Primary osteoarthritis involving multiple joints 01/03/2024   Psoriasis 05/18/2015   Psoriatic arthritis (HCC) 04/29/2015   S/P left THA, AA 12/17/2014   Sciatica, right side 04/29/2015   Seasonal allergies 08/07/2019   Severe sinus bradycardia 09/15/2016   Sick sinus syndrome (HCC) 02/18/2016   Sinus bradycardia 01/01/2016   Sleep apnea    cpap- does not know settings   Thrombophlebitis of superficial veins of left lower extremity 02/26/2019   Thyroid nodule 03/16/2017   TSH elevation 09/08/2018   Vaginal yeast infection 09/24/2023   Varicose veins of bilateral lower extremities with pain 03/20/2019   Venous insufficiency 04/29/2015   Vitamin D deficiency 04/29/2015    Current Medications: Active Medications[1]    EKGs/Labs/Other Studies Reviewed:    The following studies were reviewed today:  Cardiac Studies & Procedures   ______________________________________________________________________________________________   STRESS TESTS  EXERCISE TOLERANCE TEST (ETT) 02/02/2017  Interpretation Summary  Blood pressure demonstrated a hypertensive response to exercise.  There was no ST segment deviation noted during stress.  Normal ETT No ischemia HTN response to exercise   ECHOCARDIOGRAM  ECHOCARDIOGRAM COMPLETE 01/27/2024  Narrative ECHOCARDIOGRAM REPORT  Patient Name:   Lindsey Murphy Date of Exam: 01/27/2024 Medical Rec #:  [id number]        Height:       64.0 in Accession #:    [id number]       Weight:       235.4 lb Date of Birth:  1955/04/04         BSA:          2.097 m Patient Age:    68 years         BP:           120/80 mmHg Patient Gender: F                HR:           83 bpm. Exam Location:  [facility]  Procedure: 2D Echo, 3D Echo, Cardiac Doppler, Color Doppler and Strain Analysis (Both Spectral and Color Flow Doppler were utilized during procedure).  Indications:    Hypertensive heart  disease with chronic combined systolic and diastolic congestive heart failure (HCC) [I11.0, I50.42 (ICD-10-CM)], Coronary artery disease of native artery of native heart with stable angina pectoris [I25.118 (ICD-10-CM)], Mixed hyperlipidemia [E78.2 (ICD-10-CM)]  History:        Patient has prior history of Echocardiogram examinations, most recent 09/09/2020. CHF, CAD, Pacemaker, Arrythmias:PVC; Risk Factors:Hypertension.  Sonographer:    Charlie Jointer RDCS Referring Phys: [id number] Georgia Baria J Marylee Belzer  IMPRESSIONS   1. Left ventricular ejection fraction, by estimation, is 55 to 60%. Left ventricular ejection fraction by 3D volume is 57 %. The left ventricle has normal function. The left ventricle demonstrates regional wall motion abnormalities (see scoring diagram/findings for description). The left ventricular internal cavity size was mildly dilated. Left ventricular diastolic parameters are indeterminate. There is mild hypokinesis of the left ventricular, basal-mid inferoseptal wall. The average left ventricular global longitudinal strain is -20.5 %. The global longitudinal strain is normal. 2. Right ventricular systolic function is normal. The right ventricular size is normal. There is normal pulmonary artery systolic pressure. 3. The mitral valve is normal in structure. Moderate mitral valve regurgitation. No evidence of mitral stenosis. 4. The aortic valve is normal in structure. Aortic valve regurgitation is mild. Aortic valve sclerosis/calcification is present, without any evidence of aortic stenosis. 5. The inferior vena cava is normal in size with greater than 50% respiratory variability, suggesting right atrial pressure of 3 mmHg.  FINDINGS Left Ventricle: Left ventricular ejection fraction, by estimation, is 55 to 60%. Left ventricular ejection fraction by 3D volume is 57 %. The left ventricle has normal function. The left ventricle demonstrates regional wall motion abnormalities.  Mild hypokinesis of the left ventricular, basal-mid inferoseptal wall. The average left ventricular global longitudinal strain is -20.5 %. Strain was performed and the global longitudinal strain is normal. The left ventricular internal cavity size was mildly dilated. There is no left ventricular hypertrophy. Left ventricular diastolic parameters are indeterminate.   LV Wall Scoring: The inferior wall is hypokinetic.  Right Ventricle: The right ventricular size is normal. No increase in right ventricular wall thickness. Right ventricular systolic function is normal. There is normal pulmonary artery systolic pressure. The tricuspid regurgitant velocity is 2.48 m/s, and with an assumed right atrial pressure of 3 mmHg, the estimated right ventricular systolic pressure is 27.6 mmHg.  Left Atrium: Left atrial size was normal in size.  Right Atrium: Right atrial size was normal in size.  Pericardium: There is no evidence of pericardial effusion.  Mitral Valve: The mitral  valve is normal in structure. Moderate mitral valve regurgitation, with posteriorly-directed jet. No evidence of mitral valve stenosis.  Tricuspid Valve: The tricuspid valve is normal in structure. Tricuspid valve regurgitation is mild . No evidence of tricuspid stenosis.  Aortic Valve: The aortic valve is normal in structure. Aortic valve regurgitation is mild. Aortic regurgitation PHT measures 401 msec. Aortic valve sclerosis/calcification is present, without any evidence of aortic stenosis.  Pulmonic Valve: The pulmonic valve was normal in structure. Pulmonic valve regurgitation is not visualized. No evidence of pulmonic stenosis.  Aorta: The aortic root is normal in size and structure.  Venous: The inferior vena cava is normal in size with greater than 50% respiratory variability, suggesting right atrial pressure of 3 mmHg.  IAS/Shunts: No atrial level shunt detected by color flow Doppler.  Additional Comments: 3D was  performed not requiring image post processing on an independent workstation and was normal.   LEFT VENTRICLE PLAX 2D LVIDd:         6.00 cm         Diastology LVIDs:         3.90 cm         LV e' medial:    8.59 cm/s LV PW:         0.90 cm         LV E/e' medial:  14.0 LV IVS:        0.90 cm         LV e' lateral:   13.70 cm/s LVOT diam:     1.90 cm         LV E/e' lateral: 8.8 LV SV:         73 LV SV Index:   35              2D Longitudinal LVOT Area:     2.84 cm        Strain 2D Strain GLS   -20.5 % Avg:  3D Volume EF LV 3D EF:    Left ventricul ar ejection fraction by 3D volume is 57 %.  3D Volume EF: 3D EF:        57 % LV EDV:       164 ml LV ESV:       70 ml LV SV:        94 ml  RIGHT VENTRICLE             IVC RV Basal diam:  3.50 cm     IVC diam: 1.80 cm RV Mid diam:    2.90 cm RV S prime:     14.60 cm/s TAPSE (M-mode): 2.7 cm  LEFT ATRIUM             Index        RIGHT ATRIUM           Index LA diam:        4.10 cm 1.96 cm/m   RA Area:     14.10 cm LA Vol (A2C):   62.3 ml 29.71 ml/m  RA Volume:   34.10 ml  16.26 ml/m LA Vol (A4C):   55.8 ml 26.61 ml/m LA Biplane Vol: 61.8 ml 29.48 ml/m AORTIC VALVE             PULMONIC VALVE LVOT Vmax:   116.67 cm/s PR End Diast Vel: 3.54 msec LVOT Vmean:  78.867 cm/s LVOT VTI:    0.259 m AI PHT:      401 msec  AORTA Ao  Root diam: 3.20 cm Ao Asc diam:  3.10 cm Ao Desc diam: 2.10 cm  MITRAL VALVE                  TRICUSPID VALVE MV Area (PHT): 3.17 cm       TR Peak grad:   24.6 mmHg MV Decel Time: 239 msec       TR Vmax:        248.00 cm/s MR Peak grad:    94.5 mmHg MR Mean grad:    67.0 mmHg    SHUNTS MR Vmax:         486.00 cm/s  Systemic VTI:  0.26 m MR Vmean:        389.0 cm/s   Systemic Diam: 1.90 cm MR PISA:         1.01 cm MR PISA Eff ROA: 8 mm MR PISA Radius:  0.40 cm MV E velocity: 120.00 cm/s MV A velocity: 127.00 cm/s MV E/A ratio:  0.94  Lamar Fitch MD Electronically signed by  Lamar Fitch MD Signature Date/Time: 01/27/2024/12:45:38 PM    Final          ______________________________________________________________________________________________          Recent Labs: 01/17/2024: BUN 20; Creatinine, Ser 0.80; NT-Pro BNP 213; Potassium 4.4; Sodium 142  Recent Lipid Panel    Component Value Date/Time   CHOL 126 01/17/2024 1325   TRIG 81 01/17/2024 1325   HDL 52 01/17/2024 1325   CHOLHDL 2.4 01/17/2024 1325   LDLCALC 58 01/17/2024 1325    Physical Exam:    VS:  BP 128/80   Pulse 68   Ht 5' 4 (1.626 m)   Wt 233 lb (105.7 kg)   SpO2 99%   BMI 39.99 kg/m     Wt Readings from Last 3 Encounters:  03/06/24 233 lb (105.7 kg)  01/05/24 235 lb 6.4 oz (106.8 kg)  10/05/23 229 lb (103.9 kg)     GEN:  Well nourished, well developed in no acute distress HEENT: Normal NECK: No JVD; No carotid bruits LYMPHATICS: No lymphadenopathy CARDIAC: RRR, no murmurs, rubs, gallops RESPIRATORY:  Clear to auscultation without rales, wheezing or rhonchi  ABDOMEN: Soft, non-tender, non-distended MUSCULOSKELETAL:  No edema; No deformity  SKIN: Warm and dry NEUROLOGIC:  Alert and oriented x 3 PSYCHIATRIC:  Normal affect    Signed, Redell Leiter, MD  03/06/2024 11:38 AM    [facility]      [1]  Current Meds  Medication Sig   Albuterol Sulfate (PROAIR RESPICLICK) 108 (90 Base) MCG/ACT AEPB Inhale 2 puffs into the lungs every 4 (four) hours as needed (Wheezing).   aspirin  EC 81 MG tablet Take 1 tablet (81 mg total) by mouth daily. Swallow whole.   atorvastatin  (LIPITOR) 10 MG tablet Take 1 tablet (10 mg total) by mouth daily.   Azelastine  HCl 0.15 % SOLN Place 1 spray into the nose 2 (two) times daily.    calcium -vitamin D (OSCAL WITH D) 500-200 MG-UNIT TABS tablet Take 1 tablet by mouth 2 (two) times daily.   Cholecalciferol (VITAMIN D PO) Take 5,000 Units by mouth daily.    famotidine (PEPCID) 20 MG tablet Take 20 mg by  mouth daily as needed for heartburn or indigestion.   glucosamine-chondroitin 500-400 MG tablet Take 1 tablet by mouth 2 (two) times daily.    guaiFENesin  (MUCINEX ) 600 MG 12 hr tablet Take 600 mg by mouth at bedtime.    halobetasol  (ULTRAVATE ) 0.05 %  ointment Apply 1 application topically 2 (two) times daily as needed (Psoriasis . rash).   loratadine  (CLARITIN ) 10 MG tablet Take 10 mg by mouth daily.   metoprolol  succinate (TOPROL -XL) 50 MG 24 hr tablet Take 1 tablet (50 mg total) by mouth 2 (two) times daily. Take with or immediately following a meal.   mexiletine (MEXITIL) 200 MG capsule TAKE 1 CAPSULE TWICE DAILY   montelukast  (SINGULAIR ) 10 MG tablet Take 10 mg by mouth at bedtime.   Multiple Vitamin (MULTIVITAMIN WITH MINERALS) TABS tablet Take 1 tablet by mouth daily.   nitroGLYCERIN  (NITROSTAT ) 0.4 MG SL tablet Place 1 tablet (0.4 mg total) under the tongue every 5 (five) minutes as needed for chest pain.   potassium chloride  (KLOR-CON ) 10 MEQ tablet Take 10 mEq by mouth daily.   Torsemide  40 MG TABS Take 40 mg by mouth daily. (Patient taking differently: Take 20 mg by mouth daily.)   vitamin C (ASCORBIC ACID) 500 MG tablet Take 500 mg by mouth daily.   "

## 2024-03-06 ENCOUNTER — Ambulatory Visit: Attending: Cardiology | Admitting: Cardiology

## 2024-03-06 ENCOUNTER — Encounter: Payer: Self-pay | Admitting: Cardiology

## 2024-03-06 VITALS — BP 128/80 | HR 68 | Ht 64.0 in | Wt 233.0 lb

## 2024-03-06 DIAGNOSIS — E782 Mixed hyperlipidemia: Secondary | ICD-10-CM

## 2024-03-06 DIAGNOSIS — Z79899 Other long term (current) drug therapy: Secondary | ICD-10-CM

## 2024-03-06 DIAGNOSIS — I495 Sick sinus syndrome: Secondary | ICD-10-CM

## 2024-03-06 DIAGNOSIS — I493 Ventricular premature depolarization: Secondary | ICD-10-CM | POA: Diagnosis not present

## 2024-03-06 DIAGNOSIS — I25118 Atherosclerotic heart disease of native coronary artery with other forms of angina pectoris: Secondary | ICD-10-CM | POA: Diagnosis not present

## 2024-03-06 DIAGNOSIS — I110 Hypertensive heart disease with heart failure: Secondary | ICD-10-CM

## 2024-03-06 DIAGNOSIS — I340 Nonrheumatic mitral (valve) insufficiency: Secondary | ICD-10-CM

## 2024-03-06 DIAGNOSIS — Z950 Presence of cardiac pacemaker: Secondary | ICD-10-CM

## 2024-03-06 DIAGNOSIS — I5042 Chronic combined systolic (congestive) and diastolic (congestive) heart failure: Secondary | ICD-10-CM

## 2024-03-06 NOTE — Patient Instructions (Signed)

## 2024-03-18 NOTE — Progress Notes (Unsigned)
 " Electrophysiology Office Note:   Date:  03/19/2024  ID:  Lindsey Murphy, DOB December 16, 1955, MRN [id number]  Primary Cardiologist: Redell Leiter, MD Primary Heart Failure: None Electrophysiologist: Kainon Varady [name], MD      History of Present Illness:   Lindsey Murphy is a 69 y.o. female with h/o diastolic heart failure, congenital coronary anomaly, hypertension, symptomatic bradycardia, PVCs seen today for routine electrophysiology followup.   Discussed the use of AI scribe software for clinical note transcription with the patient, who gave verbal consent to proceed.  History of Present Illness Lindsey Murphy is a 69 year old female with a pacemaker who presents for follow-up regarding her heart condition and recent diagnosis of cirrhosis.  She is currently 69 years old.  In November, she was diagnosed with cirrhosis. She has not yet undergone a FibroScan, and her hepatologist has indicated that monitoring is the current plan. A liver transplant is a potential option, but no immediate action is being taken until further evaluation.  She is currently feeling well.  She has no chest pain or shortness of breath.  She is able to do her daily activities.  She has no acute complaints at this time.  Socially, she is currently taking care of her bedridden mother who is under hospice care. She reports a subdued holiday season due to several family incidents, including an aunt who fell and broke her hip and elbow, and the loss of a cousin last year.  she denies chest pain, palpitations, dyspnea, PND, orthopnea, nausea, vomiting, dizziness, syncope, edema, weight gain, or early satiety.   Review of systems complete and found to be negative unless listed in HPI.      EP Information / Studies Reviewed:    EKG is ordered today. Personal review as below.  EKG Interpretation Date/Time:  Monday March 19 2024 11:43:16 EST Ventricular Rate:  57 PR Interval:  180 QRS  Duration:  114 QT Interval:  432 QTC Calculation: 420 R Axis:   27  Text Interpretation: Sinus bradycardia Otherwise normal ECG When compared with ECG of 05-Jan-2024 11:05, Sinus rhythm has replaced Electronic atrial pacemaker Confirmed by Salvador Coupe ([id number]) on 03/19/2024 11:52:37 AM   PPM Interrogation-  reviewed in detail today,  See PACEART report.  Device History: Psychologist, Clinical PPM implanted  for Symptomatic bradycardia  Risk Assessment/Calculations:            Physical Exam:   VS:  BP (!) 140/76 (BP Location: Left Arm, Patient Position: Sitting, Cuff Size: Large)   Pulse (!) 57   Ht 5' 4 (1.626 m)   Wt 221 lb (100.2 kg)   SpO2 99%   BMI 37.93 kg/m    Wt Readings from Last 3 Encounters:  03/19/24 221 lb (100.2 kg)  03/06/24 233 lb (105.7 kg)  01/05/24 235 lb 6.4 oz (106.8 kg)     GEN: Well nourished, well developed in no acute distress NECK: No JVD; No carotid bruits CARDIAC: Regular rate and rhythm, no murmurs, rubs, gallops RESPIRATORY:  Clear to auscultation without rales, wheezing or rhonchi  ABDOMEN: Soft, non-tender, non-distended EXTREMITIES:  No edema; No deformity   ASSESSMENT AND PLAN:    Symptomatic bradycardia s/p Biotronik PPM  Normal PPM function See Pace Art report No changes today  2.  PVCs: On mexiletine.  No PVCs noted  3.  Congenital coronary anomaly: On Plavix , atorvastatin , Toprol -XL.  Plan per primary cardiology.  4.  Mild systolic and diastolic heart failure: No evidence of volume overload  5.  Hyperlipidemia: Continue atorvastatin   Disposition:   Follow up with EP Team in 12 months  Signed, Abir Craine [name], MD  "

## 2024-03-19 ENCOUNTER — Encounter: Payer: Self-pay | Admitting: Cardiology

## 2024-03-19 ENCOUNTER — Ambulatory Visit: Attending: Cardiology | Admitting: Cardiology

## 2024-03-19 VITALS — BP 140/76 | HR 57 | Ht 64.0 in | Wt 221.0 lb

## 2024-03-19 DIAGNOSIS — I493 Ventricular premature depolarization: Secondary | ICD-10-CM | POA: Diagnosis not present

## 2024-03-19 DIAGNOSIS — I495 Sick sinus syndrome: Secondary | ICD-10-CM

## 2024-03-19 DIAGNOSIS — E782 Mixed hyperlipidemia: Secondary | ICD-10-CM | POA: Diagnosis not present

## 2024-03-19 DIAGNOSIS — R001 Bradycardia, unspecified: Secondary | ICD-10-CM | POA: Diagnosis not present

## 2024-03-19 LAB — CUP PACEART INCLINIC DEVICE CHECK
Date Time Interrogation Session: 20260105115441
Implantable Lead Connection Status: 753985
Implantable Lead Connection Status: 753985
Implantable Lead Implant Date: 20171205
Implantable Lead Implant Date: 20171205
Implantable Lead Location: 753859
Implantable Lead Location: 753860
Implantable Lead Model: 377
Implantable Lead Model: 377
Implantable Lead Serial Number: 49684101
Implantable Lead Serial Number: 49719338
Implantable Pulse Generator Implant Date: 20171205
Pulse Gen Model: 407145
Pulse Gen Serial Number: 68906976

## 2024-04-10 ENCOUNTER — Encounter: Payer: Self-pay | Admitting: Cardiology

## 2024-04-10 DIAGNOSIS — I493 Ventricular premature depolarization: Secondary | ICD-10-CM

## 2024-04-10 DIAGNOSIS — E782 Mixed hyperlipidemia: Secondary | ICD-10-CM

## 2024-04-10 DIAGNOSIS — I495 Sick sinus syndrome: Secondary | ICD-10-CM

## 2024-04-10 DIAGNOSIS — R001 Bradycardia, unspecified: Secondary | ICD-10-CM

## 2024-04-10 DIAGNOSIS — Z79899 Other long term (current) drug therapy: Secondary | ICD-10-CM

## 2024-04-12 ENCOUNTER — Other Ambulatory Visit: Payer: Self-pay

## 2024-04-12 ENCOUNTER — Telehealth: Payer: Self-pay | Admitting: Cardiology

## 2024-04-12 NOTE — Telephone Encounter (Signed)
 Called the patient and she reported that her right leg had started swelling and she had gained 10 lbs and the right leg had developed blisters. Patient stated that she was seeing her PCP for the blisters. She also stated that she was taking Torsemide  20 mg daily. After reviewing her chart it was found that she should be taking Torsemide  40 mg daily instead of Torsemide  20 mg daily. Spoke to Dr. Monetta regarding this patient and he recommended to have her start taking Torsemide  40 mg daily as prescribed. I relayed this message to the patient and she verbalized understanding and had no further questions at this time.

## 2024-04-12 NOTE — Telephone Encounter (Signed)
 Pt c/o swelling/edema: STAT if pt has developed SOB within 24 hours  If swelling, where is the swelling located? Right leg    How much weight have you gained and in what time span? 10 lbs   Have you gained 2 pounds in a day or 5 pounds in a week? Yes   Do you have a log of your daily weights (if so, list)? No   Are you currently taking a fluid pill? Yes   Are you currently SOB? No   Have you traveled recently in a car or plane for an extended period of time? No   Pt c/o medication issue:  1. Name of Medication:   Torsemide  40 MG TABS    2. How are you currently taking this medication (dosage and times per day)? As written   3. Are you having a reaction (difficulty breathing--STAT)? No   4. What is your medication issue? Pt states 20 mg twice a day, was only taking medication once a day. Not sure what to do with extra weight gain.

## 2024-04-20 ENCOUNTER — Telehealth: Payer: Self-pay

## 2024-04-20 ENCOUNTER — Encounter: Payer: Self-pay | Admitting: Cardiology

## 2024-04-20 NOTE — Telephone Encounter (Signed)
 Received the following message below from the patient:  Hi doctor [facility], i have been taking my torsomide 40 milligrams once daily as directed and see very little change in the swelling of my feet and legs. Now my legs are blistering and constantly. Itching and I have a rash on my chest, and back. I am to see my primary care physician this morning at 11:40 if you care to advise, please do. Thanks, Lindsey Murphy the patient and she stated that she had an appointment with her PCP today and he started her on Prednisone for the rash and welts. Patient had been taking Torsemide   for some time before this latest rash developed and the patient's PCP didn't think it was related to the Torsemide . Patient was appreciative for the call and had no further questions at this time.

## 2024-04-24 ENCOUNTER — Ambulatory Visit: Payer: Medicare Other

## 2024-07-24 ENCOUNTER — Ambulatory Visit: Payer: Medicare Other

## 2024-10-23 ENCOUNTER — Ambulatory Visit: Payer: Medicare Other

## 2025-01-22 ENCOUNTER — Ambulatory Visit: Payer: Medicare Other

## 2025-04-23 ENCOUNTER — Ambulatory Visit: Payer: Medicare Other
